# Patient Record
Sex: Female | Born: 2000 | Race: Black or African American | Hispanic: No | Marital: Single | Smoking: Never smoker
Health system: Southern US, Community
[De-identification: ages and names within clinical notes are randomized; demographics above are authoritative.]

## PROBLEM LIST (undated history)

## (undated) ENCOUNTER — Ambulatory Visit (HOSPITAL_COMMUNITY)

## (undated) HISTORY — PX: WISDOM TOOTH EXTRACTION: SHX21

---
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
## 2020-01-23 ENCOUNTER — Encounter (HOSPITAL_COMMUNITY): Payer: Self-pay

## 2020-01-23 ENCOUNTER — Ambulatory Visit (HOSPITAL_COMMUNITY)
Admission: EM | Admit: 2020-01-23 | Discharge: 2020-01-23 | Disposition: A | Discharge status: Home / Self Care | Payer: Medicaid Other | Attending: Physician Assistant | Admitting: Physician Assistant

## 2020-01-23 ENCOUNTER — Other Ambulatory Visit: Payer: Self-pay

## 2020-01-23 DIAGNOSIS — R55 Syncope and collapse: Secondary | ICD-10-CM | POA: Insufficient documentation

## 2020-01-23 LAB — COMPREHENSIVE METABOLIC PANEL
ALT: 8 U/L (ref 0–44)
AST: 14 U/L — ABNORMAL LOW (ref 15–41)
Albumin: 4.3 g/dL (ref 3.5–5.0)
Alkaline Phosphatase: 45 U/L (ref 38–126)
Anion gap: 9 (ref 5–15)
BUN: 13 mg/dL (ref 6–20)
CO2: 25 mmol/L (ref 22–32)
Calcium: 9.6 mg/dL (ref 8.9–10.3)
Chloride: 105 mmol/L (ref 98–111)
Creatinine, Ser: 0.66 mg/dL (ref 0.44–1.00)
GFR, Estimated: >60 mL/min (ref >60)
Glucose, Bld: 96 mg/dL (ref 70–99)
Potassium: 3.6 mmol/L (ref 3.5–5.1)
Sodium: 139 mmol/L (ref 135–145)
Total Bilirubin: 0.9 mg/dL (ref 0.3–1.2)
Total Protein: 7.1 g/dL (ref 6.5–8.1)

## 2020-01-23 LAB — CBC WITH DIFFERENTIAL/PLATELET
Abs Immature Granulocytes: 0.01 10*3/uL (ref 0.00–0.07)
Basophils Absolute: 0 10*3/uL (ref 0.0–0.1)
Basophils Relative: 0 %
Eosinophils Absolute: 0 10*3/uL (ref 0.0–0.5)
Eosinophils Relative: 1 %
HCT: 38 % (ref 36.0–46.0)
Hemoglobin: 12.1 g/dL (ref 12.0–15.0)
Immature Granulocytes: 0 %
Lymphocytes Relative: 32 %
Lymphs Abs: 1.3 10*3/uL (ref 0.7–4.0)
MCH: 26.7 pg (ref 26.0–34.0)
MCHC: 31.8 g/dL (ref 30.0–36.0)
MCV: 83.9 fL (ref 80.0–100.0)
Monocytes Absolute: 0.4 10*3/uL (ref 0.1–1.0)
Monocytes Relative: 9 %
Neutro Abs: 2.4 10*3/uL (ref 1.7–7.7)
Neutrophils Relative %: 58 %
Platelets: 168 10*3/uL (ref 150–400)
RBC: 4.53 MIL/uL (ref 3.87–5.11)
RDW: 12.7 % (ref 11.5–15.5)
WBC: 4.1 10*3/uL (ref 4.0–10.5)
nRBC: 0 % (ref 0.0–0.2)

## 2020-01-23 NOTE — ED Notes (Signed)
Gunnar Fusi, pt mom is with the pt in the room, room door is open.

## 2020-01-23 NOTE — Discharge Instructions (Addendum)
Make sure that you eat 3 meals a day.  Drink plenty of fluids.  Follow up at [facility] if symptoms persist

## 2020-01-23 NOTE — ED Provider Notes (Signed)
[facility]    CSN: [id number] Arrival date & time: 01/23/20  1000      History   Chief Complaint Chief Complaint  Patient presents with  . Near Syncope    HPI Jean Thomas is a 19 y.o. female.   The history is provided by the patient. No language interpreter was used.  Near Syncope This is a recurrent problem. The current episode started 6 to 12 hours ago. The problem occurs every several days. The problem has been gradually worsening. Pertinent negatives include no chest pain, no abdominal pain and no shortness of breath. Nothing aggravates the symptoms. She has tried nothing for the symptoms. The treatment provided no relief.  Pt admits to stress and some depression.  Pt thinks these episodes are probably due to stress.  Pt reports episodes usually occur at work.  Pt reports she does not eat 3 meals a day.  She normally eats at 11pm at night  History reviewed. No pertinent past medical history.  There are no problems to display for this patient.   History reviewed. No pertinent surgical history.  OB History   No obstetric history on file.      Home Medications    Prior to Admission medications   Medication Sig Start Date End Date Taking? Authorizing Provider  dicyclomine (BENTYL) 20 MG tablet Take 1 tablet (20 mg total) by mouth every 12 (twelve) hours as needed (abdominal pain/cramping). 10/11/16   Antony Madura, PA-C  ondansetron (ZOFRAN ODT) 4 MG disintegrating tablet Take 1 tablet (4 mg total) by mouth every 8 (eight) hours as needed for nausea or vomiting. 10/11/16   Antony Madura, PA-C    Family History History reviewed. No pertinent family history.  Social History Social History   Tobacco Use  . Smoking status: Never Smoker  . Smokeless tobacco: Never Used  Substance Use Topics  . Alcohol use: Not on file  . Drug use: Not on file     Allergies   Peanut-containing drug products   Review of Systems Review of Systems  Constitutional:  Negative for chills and fever.  Eyes: Negative for pain and visual disturbance.  Respiratory: Negative for cough and shortness of breath.   Cardiovascular: Positive for near-syncope. Negative for chest pain and palpitations.  Gastrointestinal: Negative for abdominal pain and vomiting.  Musculoskeletal: Negative for back pain.  Skin: Negative for rash.  All other systems reviewed and are negative.    Physical Exam Triage Vital Signs ED Triage Vitals  Enc Vitals Group     BP 01/23/20 1026 111/61     Pulse Rate 01/23/20 1026 71     Resp 01/23/20 1026 18     Temp 01/23/20 1026 98 F (36.7 C)     Temp Source 01/23/20 1026 Oral     SpO2 01/23/20 1026 100 %     Weight --      Height --      Head Circumference --      Peak Flow --      Pain Score 01/23/20 1031 0     Pain Loc --      Pain Edu? --      Excl. in [place]? --    No data found.  Updated Vital Signs BP 111/61 (BP Location: Right Arm)   Pulse 71   Temp 98 F (36.7 C) (Oral)   Resp 18   LMP  (Within Weeks) Comment: 2 weeks  SpO2 100%   Visual Acuity Right Eye Distance:  Left Eye Distance:   Bilateral Distance:    Right Eye Near:   Left Eye Near:    Bilateral Near:     Physical Exam   UC Treatments / Results  Labs (all labs ordered are listed, but only abnormal results are displayed) Labs Reviewed  COMPREHENSIVE METABOLIC PANEL - Abnormal; Notable for the following components:      Result Value   AST 14 (*)    All other components within normal limits  CBC WITH DIFFERENTIAL/PLATELET    EKG   Radiology No results found.  Procedures Procedures (including critical care time)  Medications Ordered in UC Medications - No data to display  Initial Impression / Assessment and Plan / UC Course  I have reviewed the triage vital signs and the nursing notes.  Pertinent labs & imaging results that were available during my care of the patient were reviewed by me and considered in my medical decision making  (see chart for details).     MDM:  Pt counseled on eating 3 meals aday.  Pt advised to drink plenty of fluids  Final Clinical Impressions(s) / UC Diagnoses   Final diagnoses:  Near syncope     Discharge Instructions     Make sure that you eat 3 meals a day.  Drink plenty of fluids.  Follow up at [facility] if symptoms persist    ED Prescriptions    None     PDMP not reviewed this encounter.  An After Visit Summary was printed and given to the patient.   Elson Areas, [place] 01/23/20 1555

## 2020-01-23 NOTE — ED Notes (Signed)
Provider [place] PA was notified about pt refusal to answer the suicidal assessment.

## 2020-01-23 NOTE — ED Triage Notes (Signed)
Pt reports having "black outs" since October 2021. States her vision "go off" and sometimes feeling hot before this happens.States this episodes can happen at anytime. States she is a very stress person in general, not having appetite sometimes.  Denies falling on the ground. Denies chest pain, vomiting, headache.

## 2021-11-09 ENCOUNTER — Ambulatory Visit (HOSPITAL_COMMUNITY): Payer: Self-pay

## 2022-05-24 ENCOUNTER — Ambulatory Visit (HOSPITAL_COMMUNITY)
Admission: RE | Admit: 2022-05-24 | Discharge: 2022-05-24 | Disposition: A | Discharge status: Home / Self Care | Payer: Medicaid Other | Source: Ambulatory Visit | Attending: Nurse Practitioner | Admitting: Nurse Practitioner

## 2022-05-24 ENCOUNTER — Encounter (HOSPITAL_COMMUNITY): Payer: Self-pay

## 2022-05-24 VITALS — BP 103/70 | HR 78 | Temp 98.3°F | Resp 12

## 2022-05-24 DIAGNOSIS — Z7251 High risk heterosexual behavior: Secondary | ICD-10-CM

## 2022-05-24 DIAGNOSIS — R350 Frequency of micturition: Secondary | ICD-10-CM

## 2022-05-24 LAB — POCT URINALYSIS DIPSTICK, ED / UC
Bilirubin Urine: NEGATIVE
Glucose, UA: NEGATIVE mg/dL
Ketones, ur: NEGATIVE mg/dL
Nitrite: NEGATIVE
Protein, ur: NEGATIVE mg/dL
Specific Gravity, Urine: >=1.03 (ref 1.005–1.030)
Urobilinogen, UA: 0.2 mg/dL (ref 0.0–1.0)
pH: 6 (ref 5.0–8.0)

## 2022-05-24 LAB — POC URINE PREG, ED: Preg Test, Ur: NEGATIVE

## 2022-05-24 NOTE — Discharge Instructions (Addendum)
As we discussed, the urine sample today shows a little bit of blood and likely a little bit of leukocyte Estrace.  We are sending the urine out for culture and will call you if this comes back abnormal in a couple of days.  We have also tested today for gonorrhea, chlamydia, trichomonas, HIV, and syphilis.  Will call you later this week if any of this comes back positive.  In the meantime, please use condoms with every sexual encounter.  Recommend following up with an OB/GYN if your symptoms persist and all of the testing here today is negative.  Contact information is below

## 2022-05-24 NOTE — ED Triage Notes (Signed)
Pt is here for issues with urinating. Pt states she has been having urgency , frequency , pressure on and off x 51yrthe patient has not tried any OTC meds .

## 2022-05-24 NOTE — ED Notes (Addendum)
Writer attempted to obtain blood work. Patient was moving her arm excessively and writer unable to obtain blood specimen. Patient states she did not want blood work drawn and stated, "I do not have HIV." Writer explained to patient that the tests were to see if HIV or syhphyllis were present. Patient again repeated that she did not have either one.

## 2022-05-24 NOTE — ED Provider Notes (Signed)
[facility]    CSN: [id number] Arrival date & time: 05/24/22  1353      History   Chief Complaint Chief Complaint  Patient presents with   Dysuria    HPI Jean Thomas is a 22 y.o. female.   Patient presents today for 65-monthhistory of on and off urinary frequency, urgency, voiding smaller amounts occasionally, and left low back pain.  No burning with urination, new urinary incontinence, foul urinary odor, hematuria, abdominal pain, suprapubic pain or pressure, flank pain, fever, nausea/vomiting, or vaginal discharge.  No vaginal sores, lesions, or rashes.  No groin lymphadenopathy.  Reports last menstrual period was 04/25/2022 and has been sexually active since that time.  Reports she typically has irregular periods approximately every other month.  No known exposures to STDs, however she has had recent unprotected sex and is requesting STI testing today.    History reviewed. No pertinent past medical history.  There are no problems to display for this patient.   History reviewed. No pertinent surgical history.  OB History   No obstetric history on file.      Home Medications    Prior to Admission medications   Medication Sig Start Date End Date Taking? Authorizing Provider  dicyclomine (BENTYL) 20 MG tablet Take 1 tablet (20 mg total) by mouth every 12 (twelve) hours as needed (abdominal pain/cramping). 10/11/16   HAntonietta Breach PA-C  ondansetron (ZOFRAN ODT) 4 MG disintegrating tablet Take 1 tablet (4 mg total) by mouth every 8 (eight) hours as needed for nausea or vomiting. 10/11/16   HAntonietta Breach PA-C    Family History History reviewed. No pertinent family history.  Social History Social History   Tobacco Use   Smoking status: Never   Smokeless tobacco: Never     Allergies   Peanut-containing drug products   Review of Systems Review of Systems Per HPI  Physical Exam Triage Vital Signs ED Triage Vitals  Enc Vitals Group     BP 05/24/22  1422 103/70     Pulse Rate 05/24/22 1422 78     Resp 05/24/22 1422 12     Temp 05/24/22 1422 98.3 F (36.8 C)     Temp Source 05/24/22 1422 Oral     SpO2 05/24/22 1422 98 %     Weight --      Height --      Head Circumference --      Peak Flow --      Pain Score 05/24/22 1420 0     Pain Loc --      Pain Edu? --      Excl. in [place] --    No data found.  Updated Vital Signs BP 103/70 (BP Location: Left Arm)   Pulse 78   Temp 98.3 F (36.8 C) (Oral)   Resp 12   SpO2 98%   Visual Acuity Right Eye Distance:   Left Eye Distance:   Bilateral Distance:    Right Eye Near:   Left Eye Near:    Bilateral Near:     Physical Exam Vitals and nursing note reviewed.  Constitutional:      General: She is not in acute distress.    Appearance: Normal appearance. She is not toxic-appearing.  HENT:     Mouth/Throat:     Mouth: Mucous membranes are moist.     Pharynx: Oropharynx is clear.  Abdominal:     General: Abdomen is flat. Bowel sounds are normal. There is no distension.  Palpations: Abdomen is soft.     Tenderness: There is no abdominal tenderness. There is no right CVA tenderness, left CVA tenderness, guarding or rebound.  Skin:    General: Skin is warm and dry.     Coloration: Skin is not jaundiced or pale.     Findings: No erythema.  Neurological:     Mental Status: She is alert and oriented to person, place, and time.     Motor: No weakness.     Gait: Gait normal.  Psychiatric:        Mood and Affect: Mood normal.        Behavior: Behavior is cooperative.      UC Treatments / Results  Labs (all labs ordered are listed, but only abnormal results are displayed) Labs Reviewed  POCT URINALYSIS DIPSTICK, ED / UC - Abnormal; Notable for the following components:      Result Value   Hgb urine dipstick TRACE (*)    Leukocytes,Ua TRACE (*)    All other components within normal limits  URINE CULTURE  HIV ANTIBODY (ROUTINE TESTING W REFLEX)  RPR  POC URINE PREG,  ED  CERVICOVAGINAL ANCILLARY ONLY    EKG   Radiology No results found.  Procedures Procedures (including critical care time)  Medications Ordered in UC Medications - No data to display  Initial Impression / Assessment and Plan / UC Course  I have reviewed the triage vital signs and the nursing notes.  Pertinent labs & imaging results that were available during my care of the patient were reviewed by me and considered in my medical decision making (see chart for details).   Patient is well-appearing, normotensive, afebrile, not tachycardic, not tachypneic, oxygenating well on room air.    1. Urinary frequency Urinalysis today positive for trace hemoglobin, trace leukocyte esterase Urine culture is pending Given no dysuria and chronicity, will defer treatment until urine culture results Recommended increasing hydration with water in meantime Follow-up with GYN if symptoms persist and all testing is negative  2. Unprotected sexual intercourse Self swab performed to check for gonorrhea, chlamydia, trichomonas HIV and syphilis testing also obtained per patient request Recommended condom use with every sexual encounter  The patient was given the opportunity to ask questions.  All questions answered to their satisfaction.  The patient is in agreement to this plan.    Final Clinical Impressions(s) / UC Diagnoses   Final diagnoses:  Urinary frequency  Unprotected sexual intercourse     Discharge Instructions      As we discussed, the urine sample today shows a little bit of blood and likely a little bit of leukocyte Estrace.  We are sending the urine out for culture and will call you if this comes back abnormal in a couple of days.  We have also tested today for gonorrhea, chlamydia, trichomonas, HIV, and syphilis.  Will call you later this week if any of this comes back positive.  In the meantime, please use condoms with every sexual encounter.  Recommend following up with an  OB/GYN if your symptoms persist and all of the testing here today is negative.  Contact information is below     ED Prescriptions   None    PDMP not reviewed this encounter.   Eulogio Bear, NP 05/24/22 1539

## 2022-05-24 NOTE — ED Notes (Signed)
Provider notified that the patient did not want blood work drawn.

## 2022-05-25 LAB — URINE CULTURE: Culture: NO GROWTH

## 2022-05-25 LAB — CERVICOVAGINAL ANCILLARY ONLY
Bacterial Vaginitis (gardnerella): POSITIVE — AB
Chlamydia: NEGATIVE
Comment: NEGATIVE
Comment: NEGATIVE
Comment: NEGATIVE
Comment: NORMAL
Neisseria Gonorrhea: NEGATIVE
Trichomonas: POSITIVE — AB

## 2022-05-26 ENCOUNTER — Telehealth (HOSPITAL_COMMUNITY): Payer: Self-pay | Admitting: Emergency Medicine

## 2022-05-26 MED ORDER — METRONIDAZOLE 500 MG PO TABS: 500.0000 mg | ORAL_TABLET | Freq: Two times a day (BID) | ORAL | 0 refills | Status: DC

## 2022-06-11 ENCOUNTER — Ambulatory Visit (HOSPITAL_COMMUNITY)
Admission: RE | Admit: 2022-06-11 | Discharge: 2022-06-11 | Disposition: A | Discharge status: Home / Self Care | Payer: Self-pay | Source: Ambulatory Visit | Attending: Emergency Medicine | Admitting: Emergency Medicine

## 2022-06-11 VITALS — BP 133/84 | HR 91 | Temp 98.7°F

## 2022-06-11 DIAGNOSIS — R3915 Urgency of urination: Secondary | ICD-10-CM | POA: Insufficient documentation

## 2022-06-11 DIAGNOSIS — Z113 Encounter for screening for infections with a predominantly sexual mode of transmission: Secondary | ICD-10-CM | POA: Insufficient documentation

## 2022-06-11 LAB — POCT URINALYSIS DIPSTICK, ED / UC
Bilirubin Urine: NEGATIVE
Glucose, UA: NEGATIVE mg/dL
Hgb urine dipstick: NEGATIVE
Ketones, ur: NEGATIVE mg/dL
Nitrite: NEGATIVE
Protein, ur: NEGATIVE mg/dL
Specific Gravity, Urine: 1.025 (ref 1.005–1.030)
Urobilinogen, UA: 0.2 mg/dL (ref 0.0–1.0)
pH: 6.5 (ref 5.0–8.0)

## 2022-06-11 LAB — POC URINE PREG, ED: Preg Test, Ur: NEGATIVE

## 2022-06-11 NOTE — ED Provider Notes (Signed)
[facility]    CSN: [id number] Arrival date & time: 06/11/22  [id number]      History   Chief Complaint Chief Complaint  Patient presents with   Exposure to STD    Have to talk to a doctor about concerns about what's wrong - Entered by patient    HPI Jean Thomas is a 22 y.o. female.   Patient presents to clinic for ongoing dysuria, urgency and frequency.  She visited the clinic on March 11 for similar complaints and tested positive for bacterial vaginosis and trichomoniasis.  Reports she has completed her Flagyl, did have some side effects of dizziness, nausea and diarrhea.  Since then she has felt minor improvement in symptoms, but they remain.  Her last menstrual period was sometime in February, has not had a menses in March.  She denies fever, current nausea, abdominal pain or emesis.  She reports she has not been sexually active since her last visit.   The history is provided by the patient and medical records.  Exposure to STD Pertinent negatives include no chest pain, no abdominal pain, no headaches and no shortness of breath.    No past medical history on file.  There are no problems to display for this patient.   No past surgical history on file.  OB History   No obstetric history on file.      Home Medications    Prior to Admission medications   Medication Sig Start Date End Date Taking? Authorizing Provider  dicyclomine (BENTYL) 20 MG tablet Take 1 tablet (20 mg total) by mouth every 12 (twelve) hours as needed (abdominal pain/cramping). 10/11/16   Antonietta Breach, PA-C  metroNIDAZOLE (FLAGYL) 500 MG tablet Take 1 tablet (500 mg total) by mouth 2 (two) times daily. 05/26/22   Chase Picket, MD  ondansetron (ZOFRAN ODT) 4 MG disintegrating tablet Take 1 tablet (4 mg total) by mouth every 8 (eight) hours as needed for nausea or vomiting. 10/11/16   Antonietta Breach, PA-C    Family History No family history on file.  Social History Social History    Tobacco Use   Smoking status: Never   Smokeless tobacco: Never     Allergies   Peanut-containing drug products   Review of Systems Review of Systems  Constitutional:  Negative for chills and fatigue.  HENT:  Negative for sore throat.   Respiratory:  Negative for cough and shortness of breath.   Cardiovascular:  Negative for chest pain.  Gastrointestinal:  Negative for abdominal pain.  Genitourinary:  Positive for dysuria, flank pain, frequency, menstrual problem and urgency. Negative for genital sores, hematuria, vaginal bleeding and vaginal discharge.  Musculoskeletal:  Negative for arthralgias and back pain.  Neurological:  Negative for syncope and headaches.     Physical Exam Triage Vital Signs ED Triage Vitals [06/11/22 0828]  Enc Vitals Group     BP 133/84     Pulse Rate 91     Resp      Temp 98.7 F (37.1 C)     Temp Source Oral     SpO2 98 %     Weight      Height      Head Circumference      Peak Flow      Pain Score      Pain Loc      Pain Edu?      Excl. in [place]?    No data found.  Updated Vital Signs BP 133/84 (BP  Location: Left Arm)   Pulse 91   Temp 98.7 F (37.1 C) (Oral)   SpO2 98%   Visual Acuity Right Eye Distance:   Left Eye Distance:   Bilateral Distance:    Right Eye Near:   Left Eye Near:    Bilateral Near:     Physical Exam Vitals and nursing note reviewed.  Constitutional:      General: She is not in acute distress.    Appearance: She is well-developed.  HENT:     Head: Normocephalic and atraumatic.     Right Ear: External ear normal.     Left Ear: External ear normal.     Nose: Nose normal.     Mouth/Throat:     Mouth: Mucous membranes are moist.  Eyes:     General: No scleral icterus.       Right eye: No discharge.        Left eye: No discharge.     Conjunctiva/sclera: Conjunctivae normal.  Cardiovascular:     Rate and Rhythm: Normal rate and regular rhythm.  Pulmonary:     Effort: Pulmonary effort is normal.  No respiratory distress.  Musculoskeletal:        General: No swelling. Normal range of motion.  Skin:    General: Skin is warm and dry.     Capillary Refill: Capillary refill takes less than 2 seconds.  Neurological:     Mental Status: She is alert and oriented to person, place, and time.  Psychiatric:        Mood and Affect: Mood normal.      UC Treatments / Results  Labs (all labs ordered are listed, but only abnormal results are displayed) Labs Reviewed  POCT URINALYSIS DIPSTICK, ED / UC - Abnormal; Notable for the following components:      Result Value   Leukocytes,Ua TRACE (*)    All other components within normal limits  URINE CULTURE  POC URINE PREG, ED  CERVICOVAGINAL ANCILLARY ONLY    EKG   Radiology No results found.  Procedures Procedures (including critical care time)  Medications Ordered in UC Medications - No data to display  Initial Impression / Assessment and Plan / UC Course  I have reviewed the triage vital signs and the nursing notes.  Pertinent labs & imaging results that were available during my care of the patient were reviewed by me and considered in my medical decision making (see chart for details).  Vitals in triage reviewed, patient is hemodynamically stable.  Presents to clinic for ongoing urgency, frequency and dysuria.  Urinalysis positive for trace leukocytes in clinic, will send for culture, will withhold treatment at this time for urinary tract infection.  Urine pregnancy negative, patient has irregular menses.  Cervicovaginal swab results pending, will treat appropriately based on results.  Encourage patient to establish with a primary care provider, would have preferred urology due to ongoing symptoms, however, patient is without insurance.  Discussed proper bladder care and hydration, patient verbalized understanding.  No questions at this time.  Return and follow-up care discussed.    Final Clinical Impressions(s) / UC Diagnoses    Final diagnoses:  Urinary urgency  Screen for STD (sexually transmitted disease)     Discharge Instructions      Your urine was without signs of infection, I do not believe you have a urinary tract infection.  We are sending off your vaginal swab and will call if you have any sexually transmitted infections, yeast or bacterial  vaginosis.  If these are positive, we will call and treat accordingly.  To help with your urgency and frequency please ensure you are drinking at least 64 ounces of water daily.  Avoid urinary irritants like caffeine, coffee, energy drinks, sodas and juices.  Please ensure you are wiping from front to back and wearing cotton underwear that is breathable.  You can consider starting an over-the-counter probiotic that has lactobacillus in it, the active microorganism in the healthy vagina.   Please follow-up with a primary care provider, I have attached information for [facility] [facility].  You can return to clinic if you have any new or worsening symptoms.      ED Prescriptions   None    PDMP not reviewed this encounter.   Louretta Shorten [place], [place] 06/11/22 [phone]

## 2022-06-11 NOTE — Discharge Instructions (Signed)
Your urine was without signs of infection, I do not believe you have a urinary tract infection.  We are sending off your vaginal swab and will call if you have any sexually transmitted infections, yeast or bacterial vaginosis.  If these are positive, we will call and treat accordingly.  To help with your urgency and frequency please ensure you are drinking at least 64 ounces of water daily.  Avoid urinary irritants like caffeine, coffee, energy drinks, sodas and juices.  Please ensure you are wiping from front to back and wearing cotton underwear that is breathable.  You can consider starting an over-the-counter probiotic that has lactobacillus in it, the active microorganism in the healthy vagina.   Please follow-up with a primary care provider, I have attached information for [place] [facility].  You can return to clinic if you have any new or worsening symptoms.

## 2022-06-11 NOTE — ED Triage Notes (Signed)
Pt is here for Dysuria since last visit on 05/24/2022. Pt reports her is urine is cloudy with an odor. Pt would like to be check for STD.  Pt states she completed her course of Flagyl

## 2022-06-12 LAB — URINE CULTURE: Culture: NO GROWTH

## 2022-06-14 ENCOUNTER — Telehealth (HOSPITAL_COMMUNITY): Payer: Self-pay | Admitting: Emergency Medicine

## 2022-06-14 LAB — CERVICOVAGINAL ANCILLARY ONLY
Bacterial Vaginitis (gardnerella): POSITIVE — AB
Candida Glabrata: NEGATIVE
Candida Vaginitis: POSITIVE — AB
Chlamydia: NEGATIVE
Comment: NEGATIVE
Comment: NEGATIVE
Comment: NEGATIVE
Comment: NEGATIVE
Comment: NEGATIVE
Comment: NORMAL
Neisseria Gonorrhea: NEGATIVE
Trichomonas: NEGATIVE

## 2022-06-14 MED ORDER — FLUCONAZOLE 150 MG PO TABS: 150.0000 mg | ORAL_TABLET | Freq: Once | ORAL | 0 refills | Status: AC

## 2022-06-14 MED ORDER — METRONIDAZOLE 500 MG PO TABS: 500.0000 mg | ORAL_TABLET | Freq: Two times a day (BID) | ORAL | 0 refills | Status: DC

## 2022-10-04 ENCOUNTER — Ambulatory Visit: Payer: Self-pay

## 2022-10-04 ENCOUNTER — Ambulatory Visit (HOSPITAL_COMMUNITY)
Admission: RE | Admit: 2022-10-04 | Discharge: 2022-10-04 | Disposition: A | Discharge status: Home / Self Care | Payer: Self-pay | Source: Ambulatory Visit | Attending: Emergency Medicine | Admitting: Emergency Medicine

## 2022-10-04 ENCOUNTER — Encounter (HOSPITAL_COMMUNITY): Payer: Self-pay

## 2022-10-04 ENCOUNTER — Telehealth: Payer: Self-pay | Admitting: Nurse Practitioner

## 2022-10-04 VITALS — BP 111/75 | HR 72 | Temp 98.5°F | Resp 16 | Ht 68.0 in

## 2022-10-04 DIAGNOSIS — N3001 Acute cystitis with hematuria: Secondary | ICD-10-CM | POA: Insufficient documentation

## 2022-10-04 DIAGNOSIS — Z113 Encounter for screening for infections with a predominantly sexual mode of transmission: Secondary | ICD-10-CM | POA: Insufficient documentation

## 2022-10-04 DIAGNOSIS — R300 Dysuria: Secondary | ICD-10-CM

## 2022-10-04 LAB — POCT URINALYSIS DIP (MANUAL ENTRY)
Bilirubin, UA: NEGATIVE
Glucose, UA: NEGATIVE mg/dL
Ketones, POC UA: NEGATIVE mg/dL
Nitrite, UA: NEGATIVE
Protein Ur, POC: NEGATIVE mg/dL
Spec Grav, UA: 1.025 (ref 1.010–1.025)
Urobilinogen, UA: 0.2 E.U./dL
pH, UA: 6.5 (ref 5.0–8.0)

## 2022-10-04 LAB — POCT URINE PREGNANCY: Preg Test, Ur: NEGATIVE

## 2022-10-04 MED ORDER — SULFAMETHOXAZOLE-TRIMETHOPRIM 800-160 MG PO TABS: 1.0000 | ORAL_TABLET | Freq: Two times a day (BID) | ORAL | 0 refills | Status: AC

## 2022-10-04 NOTE — ED Notes (Signed)
Pt requested this RN to draw blood from specific vein. This RN told patient it was a small vein but I would try. This RN found a good vein in her [facility] but patient refused. RN was unsuccessful in vein patient requested. Pt refusing blood work at this time. Provider notified.

## 2022-10-04 NOTE — ED Provider Notes (Signed)
[facility]    CSN: [id number] Arrival date & time: 10/04/22  1215    HISTORY   Chief Complaint  Patient presents with   Exposure to STD    Entered by patient   HPI Jean Thomas is a pleasant, 22 y.o. female who presents to urgent care today. Patient complains of increased frequency of urination and left-sided lower back pain for the past 9 to 10 days.  Patient denies fever, malaise, fatigue, body aches, chills, hematuria, urine malodor.  Patient states he had unprotected sexual intercourse 10 days ago, would like to be tested for STDs.  Patient states she is feeling anxious about this situation.  Patient denies abnormal vaginal discharge, genital lesion, vaginal itching or burning, pelvic pain, pelvic pressure.  The history is provided by the patient.   History reviewed. No pertinent past medical history. There are no problems to display for this patient.  History reviewed. No pertinent surgical history. OB History   No obstetric history on file.    Home Medications    Prior to Admission medications   Medication Sig Start Date End Date Taking? Authorizing Provider  dicyclomine (BENTYL) 20 MG tablet Take 1 tablet (20 mg total) by mouth every 12 (twelve) hours as needed (abdominal pain/cramping). 10/11/16   Antony Madura, PA-C  metroNIDAZOLE (FLAGYL) 500 MG tablet Take 1 tablet (500 mg total) by mouth 2 (two) times daily. 06/14/22   Merrilee Jansky, MD  ondansetron (ZOFRAN ODT) 4 MG disintegrating tablet Take 1 tablet (4 mg total) by mouth every 8 (eight) hours as needed for nausea or vomiting. 10/11/16   Antony Madura, PA-C    Family History History reviewed. No pertinent family history. Social History Social History   Tobacco Use   Smoking status: Never   Smokeless tobacco: Never   Allergies   Peanut-containing drug products  Review of Systems Review of Systems Pertinent findings revealed after performing a 14 point review of systems has been noted in the  history of present illness.  Physical Exam Vital Signs BP 111/75 (BP Location: Left Arm)   Pulse 72   Temp 98.5 F (36.9 C) (Oral)   Resp 16   Ht 5\' 8"  (1.727 m)   LMP 10/03/2022 (Approximate)   SpO2 98%   No data found.  Physical Exam Vitals and nursing note reviewed.  Constitutional:      General: She is not in acute distress.    Appearance: Normal appearance. She is not ill-appearing.  HENT:     Head: Normocephalic and atraumatic.  Eyes:     General: Lids are normal.        Right eye: No discharge.        Left eye: No discharge.     Extraocular Movements: Extraocular movements intact.     Conjunctiva/sclera: Conjunctivae normal.     Right eye: Right conjunctiva is not injected.     Left eye: Left conjunctiva is not injected.  Neck:     Trachea: Trachea and phonation normal.  Cardiovascular:     Rate and Rhythm: Normal rate and regular rhythm.     Pulses: Normal pulses.     Heart sounds: Normal heart sounds. No murmur heard.    No friction rub. No gallop.  Pulmonary:     Effort: Pulmonary effort is normal. No accessory muscle usage, prolonged expiration or respiratory distress.     Breath sounds: Normal breath sounds. No stridor, decreased air movement or transmitted upper airway sounds. No decreased breath sounds,  wheezing, rhonchi or rales.  Chest:     Chest wall: No tenderness.  Abdominal:     General: Abdomen is flat. Bowel sounds are normal. There is no distension.     Palpations: Abdomen is soft.     Tenderness: There is no abdominal tenderness. There is no right CVA tenderness or left CVA tenderness.     Hernia: No hernia is present.  Genitourinary:    Comments: Patient politely declines pelvic exam today, patient provided a vaginal swab for testing. Musculoskeletal:        General: Normal range of motion.     Cervical back: Normal range of motion and neck supple. Normal range of motion.  Lymphadenopathy:     Cervical: No cervical adenopathy.  Skin:     General: Skin is warm and dry.     Findings: No erythema or rash.  Neurological:     General: No focal deficit present.     Mental Status: She is alert and oriented to person, place, and time.  Psychiatric:        Mood and Affect: Mood normal.        Behavior: Behavior normal.     Visual Acuity Right Eye Distance:   Left Eye Distance:   Bilateral Distance:    Right Eye Near:   Left Eye Near:    Bilateral Near:     UC Couse / Diagnostics / Procedures:     Radiology No results found.  Procedures Procedures (including critical care time) EKG  Pending results:  Labs Reviewed  POCT URINALYSIS DIP (MANUAL ENTRY) - Abnormal; Notable for the following components:      Result Value   Blood, UA large (*)    Leukocytes, UA Moderate (2+) (*)    All other components within normal limits  HIV ANTIBODY (ROUTINE TESTING W REFLEX)  RPR  POCT URINE PREGNANCY  CERVICOVAGINAL ANCILLARY ONLY    Medications Ordered in UC: Medications - No data to display  UC Diagnoses / Final Clinical Impressions(s)   I have reviewed the triage vital signs and the nursing notes.  Pertinent labs & imaging results that were available during my care of the patient were reviewed by me and considered in my medical decision making (see chart for details).    Final diagnoses:  Screen for STD (sexually transmitted disease)  Acute cystitis with hematuria   STD screening was performed, patient advised that the results be posted to their MyChart and if any of the results are positive, they will be notified by phone, further treatment will be provided as indicated based on results of STD screening. Patient was advised to abstain from sexual intercourse until that they receive the results of their STD testing.  Patient was also advised to use condoms to protect themselves from STD exposure.  Urine dip today revealed hematuria to a large degree and pyuria.   Patient was advised to begin antibiotics now due to  findings on urine dip. Patient was advised to begin antibiotics today due to having active symptoms of urinary tract infection.                    Patient was advised to take all doses exactly as prescribed.  Patient also advised of risks of worsening infection with incomplete antibiotic therapy. Return precautions advised.  Please see discharge instructions below for details of plan of care as provided to patient. ED Prescriptions     Medication Sig Dispense Auth. Provider   sulfamethoxazole-trimethoprim (  BACTRIM DS) 800-160 MG tablet Take 1 tablet by mouth 2 (two) times daily for 5 days. 10 tablet Theadora Rama Scales, PA-C      PDMP not reviewed this encounter.  Disposition Upon Discharge:  Condition: stable for discharge home  Patient presented with concern for an acute illness with associated systemic symptoms and significant discomfort requiring urgent management. In my opinion, this is a condition that a prudent lay person (someone who possesses an average knowledge of health and medicine) may potentially expect to result in complications if not addressed urgently such as respiratory distress, impairment of bodily function or dysfunction of bodily organs.   As such, the patient has been evaluated and assessed, work-up was performed and treatment was provided in alignment with urgent care protocols and evidence based medicine.  Patient/parent/caregiver has been advised that the patient may require follow up for further testing and/or treatment if the symptoms continue in spite of treatment, as clinically indicated and appropriate.  Routine symptom specific, illness specific and/or disease specific instructions were discussed with the patient and/or caregiver at length.  Prevention strategies for avoiding STD exposure were also discussed.  The patient will follow up with their current PCP if and as advised. If the patient does not currently have a PCP we will assist them in obtaining  one.   The patient may need specialty follow up if the symptoms continue, in spite of conservative treatment and management, for further workup, evaluation, consultation and treatment as clinically indicated and appropriate.  Patient/parent/caregiver verbalized understanding and agreement of plan as discussed.  All questions were addressed during visit.  Please see discharge instructions below for further details of plan.  Discharge Instructions:   Discharge Instructions      Common causes of urinary tract infections include but are not limited to holding your urine longer than you should, squatting instead of sitting down when urinating, sitting around in wet clothing such as a wet swimsuit or gym clothes too long, not emptying your bladder after having sexual intercourse, wiping from back to front instead of front to back after having a bowel movement.     The urinalysis that we performed in the clinic today was abnormal.     You were advised to begin antibiotics today because your urinalysis is abnormal and you are having active symptoms of an acute lower urinary tract infection also known as cystitis.  It is very important that you take all doses exactly as prescribed.  Incomplete antibiotic therapy can cause worsening urinary tract infection that can become aggressive, escape from urinary tract into your bloodstream causing sepsis which will require hospitalization.   Please pick up and begin taking your prescription for Bactrim DS (trimethoprim sulfamethoxazole) as soon as possible.  Please take all doses exactly as prescribed.  You can take this medication with or without food.  This medication is safe to take with your other medications.   If you have not had complete resolution of your symptoms after completing treatment as prescribed, please return to urgent care for repeat evaluation or follow-up with your primary care provider.  Repeat urinalysis and urine culture may be indicated for  more directed therapy.   The results of your vaginal swab test which screens for BV, yeast, gonorrhea, chlamydia and trichomonas will be made posted to your MyChart account once it is complete.  This typically takes 2 to 4 days.  Please abstain from sexual intercourse of any kind, vaginal, oral or anal, until you have received the  results of your STD testing.     The results of your HIV and syphilis blood tests will be made available to you once they are complete.  They will initially be posted to your MyChart account which typically takes 2 to 3 days.     If any of your results are abnormal, you will receive a phone call regarding treatment.  Prescriptions, if any are needed, will be provided for you at your pharmacy.     Your urine pregnancy test today is negative.    Thank you for visiting urgent care today.  I appreciate the opportunity to participate in your care.       This office note has been dictated using Teaching laboratory technician.  Unfortunately, this method of dictation can sometimes lead to typographical or grammatical errors.  I apologize for your inconvenience in advance if this occurs.  Please do not hesitate to reach out to me if clarification is needed.       Theadora Rama Scales, [place] 10/04/22 [phone]

## 2022-10-04 NOTE — Discharge Instructions (Signed)
Common causes of urinary tract infections include but are not limited to holding your urine longer than you should, squatting instead of sitting down when urinating, sitting around in wet clothing such as a wet swimsuit or gym clothes too long, not emptying your bladder after having sexual intercourse, wiping from back to front instead of front to back after having a bowel movement.     The urinalysis that we performed in the clinic today was abnormal.     You were advised to begin antibiotics today because your urinalysis is abnormal and you are having active symptoms of an acute lower urinary tract infection also known as cystitis.  It is very important that you take all doses exactly as prescribed.  Incomplete antibiotic therapy can cause worsening urinary tract infection that can become aggressive, escape from urinary tract into your bloodstream causing sepsis which will require hospitalization.   Please pick up and begin taking your prescription for Bactrim DS (trimethoprim sulfamethoxazole) as soon as possible.  Please take all doses exactly as prescribed.  You can take this medication with or without food.  This medication is safe to take with your other medications.   If you have not had complete resolution of your symptoms after completing treatment as prescribed, please return to urgent care for repeat evaluation or follow-up with your primary care provider.  Repeat urinalysis and urine culture may be indicated for more directed therapy.   The results of your vaginal swab test which screens for BV, yeast, gonorrhea, chlamydia and trichomonas will be made posted to your MyChart account once it is complete.  This typically takes 2 to 4 days.  Please abstain from sexual intercourse of any kind, vaginal, oral or anal, until you have received the results of your STD testing.     The results of your HIV and syphilis blood tests will be made available to you once they are complete.  They will  initially be posted to your MyChart account which typically takes 2 to 3 days.     If any of your results are abnormal, you will receive a phone call regarding treatment.  Prescriptions, if any are needed, will be provided for you at your pharmacy.     Your urine pregnancy test today is negative.    Thank you for visiting urgent care today.  I appreciate the opportunity to participate in your care.

## 2022-10-04 NOTE — Progress Notes (Signed)
I feel your condition warrants further evaluation and I recommend that you be seen for a face to face visit.  Please contact your primary care physician practice to be seen. Many offices offer virtual options to be seen via video if you are not comfortable going in person to a medical facility at this time.  NOTE: You will NOT be charged for this eVisit.  If you do not have a PCP, [facility] offers a free physician referral service available at [phone]. Our trained staff has the experience, knowledge and resources to put you in touch with a physician who is right for you.    If you are having a true medical emergency please call 911.   Your e-visit answers were reviewed by a board certified advanced clinical practitioner to complete your personal care plan.  Thank you for using e-Visits.  

## 2022-10-04 NOTE — ED Triage Notes (Signed)
Patient here today with c/o frequent urination and left side LB pain X 9-10 days.   She is concerns about STDs because she recently had unprotected sex.   Patient also wants to discuss anxiety.

## 2022-10-05 LAB — CERVICOVAGINAL ANCILLARY ONLY
Bacterial Vaginitis (gardnerella): POSITIVE — AB
Candida Glabrata: NEGATIVE
Candida Vaginitis: NEGATIVE
Chlamydia: NEGATIVE
Comment: NEGATIVE
Comment: NEGATIVE
Comment: NEGATIVE
Comment: NEGATIVE
Comment: NEGATIVE
Comment: NORMAL
Neisseria Gonorrhea: NEGATIVE
Trichomonas: NEGATIVE

## 2022-10-06 ENCOUNTER — Telehealth: Payer: Self-pay | Admitting: Emergency Medicine

## 2022-10-06 MED ORDER — METRONIDAZOLE 500 MG PO TABS: 500.0000 mg | ORAL_TABLET | Freq: Two times a day (BID) | ORAL | 0 refills | Status: DC

## 2022-10-08 ENCOUNTER — Telehealth (HOSPITAL_COMMUNITY): Payer: Self-pay | Admitting: Emergency Medicine

## 2022-10-08 NOTE — Telephone Encounter (Signed)
Patient left message with front desk that she needed a refill on her UTI medication due to having misplaced it.  Metronidazole sent two days ago Reviewed with Lillia Abed, APP who recommends patient finish Metronidazole medication and f/u if symptoms continue, no additional medications at this time Attempted to reach patient x 1, LVM on home and mobile

## 2022-10-08 NOTE — Telephone Encounter (Signed)
Patient returned voicemail Reviewed provider recommendations with patient and all questions answered

## 2022-10-20 ENCOUNTER — Telehealth (HOSPITAL_COMMUNITY): Payer: Self-pay | Admitting: Emergency Medicine

## 2022-10-20 NOTE — Telephone Encounter (Signed)
Patient left a voicemail stating she had shortness of breath and chest pain yesterday at work for over 30 minutes, and felt like it was related to her Metronidazole.   Attempted to return call to let her know she needs to be seen, it's been over a week since it was prescribed, and her symptoms sounded concerning, left voicemail

## 2022-10-21 ENCOUNTER — Telehealth (HOSPITAL_COMMUNITY): Payer: Self-pay

## 2022-10-21 NOTE — Telephone Encounter (Signed)
Pt called about the side effects she had while taking Flagyl. Pt expressed experiencing trouble breathing, palpitations and chest tightness -- she spoke to a pharmacist who advised her to stop taking the medication. Advised pt to see a physician as soon as she could at the ER or [facility]. Pt expressed she was not made aware of side effects. I expressed to the pt she can come in to UC, see an OBGYN or primary to follow up on vaginal sxs. Expressed she will come back in to be seen. Pt also is requesting to be given a different abx.

## 2022-10-22 ENCOUNTER — Ambulatory Visit (HOSPITAL_COMMUNITY): Payer: Self-pay

## 2022-10-22 ENCOUNTER — Ambulatory Visit
Admission: RE | Admit: 2022-10-22 | Discharge: 2022-10-22 | Disposition: A | Discharge status: Home / Self Care | Payer: Self-pay | Source: Ambulatory Visit | Attending: Physician Assistant | Admitting: Physician Assistant

## 2022-10-22 ENCOUNTER — Ambulatory Visit: Payer: Self-pay

## 2022-10-22 VITALS — BP 104/74 | HR 67 | Temp 98.2°F | Resp 16

## 2022-10-22 DIAGNOSIS — R8290 Unspecified abnormal findings in urine: Secondary | ICD-10-CM | POA: Insufficient documentation

## 2022-10-22 LAB — POCT URINALYSIS DIP (MANUAL ENTRY)
Bilirubin, UA: NEGATIVE
Blood, UA: NEGATIVE
Glucose, UA: NEGATIVE mg/dL
Ketones, POC UA: NEGATIVE mg/dL
Nitrite, UA: NEGATIVE
Protein Ur, POC: NEGATIVE mg/dL
Spec Grav, UA: 1.025 (ref 1.010–1.025)
Urobilinogen, UA: 0.2 E.U./dL
pH, UA: 7 (ref 5.0–8.0)

## 2022-10-22 MED ORDER — CIPROFLOXACIN HCL 500 MG PO TABS: 500.0000 mg | ORAL_TABLET | Freq: Two times a day (BID) | ORAL | 0 refills | Status: DC

## 2022-10-22 NOTE — ED Triage Notes (Signed)
Pt states she is having urinary frequency for the past 2 weeks.  Pt also had BV and was on Flagyl for 5 days.  States it made her sick. Was seen here for the same.

## 2022-10-22 NOTE — ED Provider Notes (Signed)
[facility]    CSN: [id number] Arrival date & time: 10/22/22  1137      History   Chief Complaint Chief Complaint  Patient presents with   Urinary Frequency    HPI Jean Thomas is a 22 y.o. female.   Patient here today for urinary frequency she has had the last 2 weeks. She was recently started on treatment for UTI but then was instructed to discontinue and start medication for BV. She states that she had suspected reaction to metronidazole but has had flagyl in the past.  The history is provided by the patient.  Urinary Frequency Pertinent negatives include no abdominal pain.    History reviewed. No pertinent past medical history.  There are no problems to display for this patient.   History reviewed. No pertinent surgical history.  OB History   No obstetric history on file.      Home Medications    Prior to Admission medications   Medication Sig Start Date End Date Taking? Authorizing Provider  ciprofloxacin (CIPRO) 500 MG tablet Take 1 tablet (500 mg total) by mouth every 12 (twelve) hours. 10/22/22  Yes Tomi Bamberger, PA-C  dicyclomine (BENTYL) 20 MG tablet Take 1 tablet (20 mg total) by mouth every 12 (twelve) hours as needed (abdominal pain/cramping). 10/11/16   Antony Madura, PA-C  metroNIDAZOLE (FLAGYL) 500 MG tablet Take 1 tablet (500 mg total) by mouth 2 (two) times daily. 10/06/22   Merrilee Jansky, MD  ondansetron (ZOFRAN ODT) 4 MG disintegrating tablet Take 1 tablet (4 mg total) by mouth every 8 (eight) hours as needed for nausea or vomiting. 10/11/16   Antony Madura, PA-C    Family History History reviewed. No pertinent family history.  Social History Social History   Tobacco Use   Smoking status: Never   Smokeless tobacco: Never     Allergies   Peanut-containing drug products   Review of Systems Review of Systems  Constitutional:  Negative for chills and fever.  Eyes:  Negative for discharge and redness.  Gastrointestinal:   Negative for abdominal pain, nausea and vomiting.  Genitourinary:  Positive for frequency.     Physical Exam Triage Vital Signs ED Triage Vitals  Encounter Vitals Group     BP      Systolic BP Percentile      Diastolic BP Percentile      Pulse      Resp      Temp      Temp src      SpO2      Weight      Height      Head Circumference      Peak Flow      Pain Score      Pain Loc      Pain Education      Exclude from Growth Chart    No data found.  Updated Vital Signs BP 104/74 (BP Location: Left Arm)   Pulse 67   Temp 98.2 F (36.8 C) (Oral)   Resp 16   LMP 10/03/2022 (Approximate)   SpO2 99%      Physical Exam Vitals and nursing note reviewed.  Constitutional:      General: She is not in acute distress.    Appearance: Normal appearance. She is not ill-appearing.  HENT:     Head: Normocephalic and atraumatic.  Eyes:     Conjunctiva/sclera: Conjunctivae normal.  Cardiovascular:     Rate and Rhythm: Normal rate.  Pulmonary:  Effort: Pulmonary effort is normal. No respiratory distress.  Neurological:     Mental Status: She is alert.  Psychiatric:        Mood and Affect: Mood normal.        Behavior: Behavior normal.        Thought Content: Thought content normal.      UC Treatments / Results  Labs (all labs ordered are listed, but only abnormal results are displayed) Labs Reviewed  POCT URINALYSIS DIP (MANUAL ENTRY) - Abnormal; Notable for the following components:      Result Value   Leukocytes, UA Small (1+) (*)    All other components within normal limits  URINE CULTURE    EKG   Radiology No results found.  Procedures Procedures (including critical care time)  Medications Ordered in UC Medications - No data to display  Initial Impression / Assessment and Plan / UC Course  I have reviewed the triage vital signs and the nursing notes.  Pertinent labs & imaging results that were available during my care of the patient were reviewed  by me and considered in my medical decision making (see chart for details).    Cipro prescribed to cover UTI and will order urine culture. Advised patient to discuss medication with pharmacist. Patient expresses understanding.   Final Clinical Impressions(s) / UC Diagnoses   Final diagnoses:  Abnormal urinalysis   Discharge Instructions   None    ED Prescriptions     Medication Sig Dispense Auth. Provider   ciprofloxacin (CIPRO) 500 MG tablet Take 1 tablet (500 mg total) by mouth every 12 (twelve) hours. 10 tablet Tomi Bamberger, PA-C      PDMP not reviewed this encounter.   Tomi Bamberger, PA-C 10/22/22 1438

## 2023-05-19 ENCOUNTER — Ambulatory Visit
Admission: EM | Admit: 2023-05-19 | Discharge: 2023-05-19 | Disposition: A | Discharge status: Home / Self Care | Payer: Self-pay | Attending: Physician Assistant | Admitting: Physician Assistant

## 2023-05-19 DIAGNOSIS — K529 Noninfective gastroenteritis and colitis, unspecified: Secondary | ICD-10-CM | POA: Insufficient documentation

## 2023-05-19 DIAGNOSIS — N898 Other specified noninflammatory disorders of vagina: Secondary | ICD-10-CM | POA: Insufficient documentation

## 2023-05-19 LAB — POCT URINALYSIS DIP (MANUAL ENTRY)
Bilirubin, UA: NEGATIVE
Blood, UA: NEGATIVE
Glucose, UA: NEGATIVE mg/dL
Ketones, POC UA: NEGATIVE mg/dL
Leukocytes, UA: NEGATIVE
Nitrite, UA: NEGATIVE
Protein Ur, POC: NEGATIVE mg/dL
Spec Grav, UA: 1.025 (ref 1.010–1.025)
Urobilinogen, UA: 0.2 U/dL
pH, UA: 7 (ref 5.0–8.0)

## 2023-05-19 MED ORDER — ONDANSETRON 4 MG PO TBDP: 4.0000 mg | ORAL_TABLET | Freq: Three times a day (TID) | ORAL | 0 refills | Status: DC | PRN

## 2023-05-19 NOTE — ED Provider Notes (Signed)
 [facility]    CSN: [id number] Arrival date & time: 05/19/23  1155      History   Chief Complaint Chief Complaint  Patient presents with   Abdominal Pain   Diarrhea    HPI Jean Thomas is a 23 y.o. female.   Patient here today for evaluation of abdominal pain and loose stools that started yesterday.  She has also had some urinary frequency and vaginal discharge concerning for BV.  She has had some nausea but no vomiting.  She denies any fever.  The history is provided by the patient.  Abdominal Pain Associated symptoms: diarrhea, nausea and vaginal discharge   Associated symptoms: no chills, no cough, no fever, no shortness of breath, no sore throat and no vomiting   Diarrhea Associated symptoms: abdominal pain   Associated symptoms: no chills, no fever and no vomiting     History reviewed. No pertinent past medical history.  There are no active problems to display for this patient.   History reviewed. No pertinent surgical history.  OB History   No obstetric history on file.      Home Medications    Prior to Admission medications   Medication Sig Start Date End Date Taking? Authorizing Provider  ibuprofen (ADVIL) 800 MG tablet Take 800 mg by mouth every 8 (eight) hours as needed. 02/15/23  Yes [provider]  ondansetron (ZOFRAN-ODT) 4 MG disintegrating tablet Take 1 tablet (4 mg total) by mouth every 8 (eight) hours as needed. 05/19/23  Yes Tomi Bamberger, PA-C  neomycin-polymyxin b-dexamethasone (MAXITROL) 3.5-10000-0.1 SUSP INSTILL 1 DROP INTO EACH EYE 4 TIMES DAILY FOR 2 WEEKS 01/06/23   [provider]    Family History History reviewed. No pertinent family history.  Social History Social History   Tobacco Use   Smoking status: Never   Smokeless tobacco: Never  Vaping Use   Vaping status: Never Used  Substance Use Topics   Alcohol use: Never   Drug use: Never     Allergies   Peanut-containing drug  products   Review of Systems Review of Systems  Constitutional:  Negative for chills and fever.  HENT:  Negative for congestion, ear pain and sore throat.   Eyes:  Negative for discharge and redness.  Respiratory:  Negative for cough, shortness of breath and wheezing.   Gastrointestinal:  Positive for abdominal pain, diarrhea and nausea. Negative for blood in stool and vomiting.  Genitourinary:  Positive for frequency and vaginal discharge.     Physical Exam Triage Vital Signs ED Triage Vitals  Encounter Vitals Group     BP 05/19/23 1220 94/81     Systolic BP Percentile --      Diastolic BP Percentile --      Pulse Rate 05/19/23 1220 72     Resp 05/19/23 1220 16     Temp 05/19/23 1220 97.9 F (36.6 C)     Temp Source 05/19/23 1220 Oral     SpO2 05/19/23 1220 100 %     Weight 05/19/23 1216 108 lb 11 oz (49.3 kg)     Height 05/19/23 1216 5\' 8"  (1.727 m)     Head Circumference --      Peak Flow --      Pain Score 05/19/23 1211 9     Pain Loc --      Pain Education --      Exclude from Growth Chart --    No data found.  Updated Vital Signs BP  94/81 (BP Location: Left Arm)   Pulse 72   Temp 97.9 F (36.6 C) (Oral)   Resp 16   Ht 5\' 8"  (1.727 m)   Wt 108 lb 11 oz (49.3 kg)   LMP 04/30/2023 (Exact Date)   SpO2 100%   BMI 16.53 kg/m   Visual Acuity Right Eye Distance:   Left Eye Distance:   Bilateral Distance:    Right Eye Near:   Left Eye Near:    Bilateral Near:     Physical Exam Vitals and nursing note reviewed.  Constitutional:      General: She is not in acute distress.    Appearance: Normal appearance. She is not ill-appearing.  HENT:     Head: Normocephalic and atraumatic.  Eyes:     Conjunctiva/sclera: Conjunctivae normal.  Cardiovascular:     Rate and Rhythm: Normal rate and regular rhythm.     Heart sounds: Normal heart sounds.  Pulmonary:     Effort: Pulmonary effort is normal. No respiratory distress.     Breath sounds: Normal breath  sounds. No wheezing, rhonchi or rales.  Abdominal:     General: Abdomen is flat. Bowel sounds are normal. There is no distension.     Palpations: Abdomen is soft.     Tenderness: There is no abdominal tenderness. There is no guarding.  Neurological:     Mental Status: She is alert.  Psychiatric:        Mood and Affect: Mood normal.        Behavior: Behavior normal.        Thought Content: Thought content normal.      UC Treatments / Results  Labs (all labs ordered are listed, but only abnormal results are displayed) Labs Reviewed  POCT URINALYSIS DIP (MANUAL ENTRY) - Abnormal; Notable for the following components:      Result Value   Clarity, UA cloudy (*)    All other components within normal limits  CERVICOVAGINAL ANCILLARY ONLY    EKG   Radiology No results found.  Procedures Procedures (including critical care time)  Medications Ordered in UC Medications - No data to display  Initial Impression / Assessment and Plan / UC Course  I have reviewed the triage vital signs and the nursing notes.  Pertinent labs & imaging results that were available during my care of the patient were reviewed by me and considered in my medical decision making (see chart for details).    Zofran prescribed for nausea.  Suspect likely viral gastroenteritis as cause of diarrhea.  Recommended bland diet, increase fluids and rest with electrolyte replacement.  Encouraged follow-up if no gradual improvement with any worsening symptoms.  Screening ordered for BV, yeast, gonorrhea, chlamydia and trichomonas.  UA without concerning findings.  Final Clinical Impressions(s) / UC Diagnoses   Final diagnoses:  Vaginal discharge  Gastroenteritis   Discharge Instructions   None    ED Prescriptions     Medication Sig Dispense Auth. Provider   ondansetron (ZOFRAN-ODT) 4 MG disintegrating tablet Take 1 tablet (4 mg total) by mouth every 8 (eight) hours as needed. 20 tablet Tomi Bamberger, PA-C       PDMP not reviewed this encounter.   Tomi Bamberger, PA-C 05/19/23 1733

## 2023-05-19 NOTE — ED Notes (Signed)
 Note: Patient did take a lot of time to answer these questions.

## 2023-05-19 NOTE — ED Triage Notes (Signed)
"  This started last night into this morning and today after eating some food yesterday (Abd pain) and having loose stools following abd pain, nausea but no vomiting". No fever.

## 2023-05-19 NOTE — ED Triage Notes (Signed)
"  Also having urinary frequency and concerns for BV".

## 2023-05-20 ENCOUNTER — Telehealth (HOSPITAL_COMMUNITY): Payer: Self-pay

## 2023-05-20 LAB — CERVICOVAGINAL ANCILLARY ONLY
Bacterial Vaginitis (gardnerella): POSITIVE — AB
Candida Glabrata: NEGATIVE
Candida Vaginitis: POSITIVE — AB
Chlamydia: NEGATIVE
Comment: NEGATIVE
Comment: NEGATIVE
Comment: NEGATIVE
Comment: NEGATIVE
Comment: NEGATIVE
Comment: NORMAL
Neisseria Gonorrhea: NEGATIVE
Trichomonas: NEGATIVE

## 2023-05-20 MED ORDER — FLUCONAZOLE 150 MG PO TABS: 150.0000 mg | ORAL_TABLET | Freq: Once | ORAL | 0 refills | Status: AC

## 2023-05-20 MED ORDER — METRONIDAZOLE 500 MG PO TABS: 500.0000 mg | ORAL_TABLET | Freq: Two times a day (BID) | ORAL | 0 refills | Status: AC

## 2023-05-20 NOTE — Telephone Encounter (Signed)
 Per protocol, pt requires tx with metronidazole and Diflucan.  Rx sent to pharmacy on file.

## 2023-06-20 ENCOUNTER — Encounter (HOSPITAL_COMMUNITY): Payer: Self-pay

## 2023-06-20 ENCOUNTER — Ambulatory Visit (HOSPITAL_COMMUNITY)
Admission: RE | Admit: 2023-06-20 | Discharge: 2023-06-20 | Disposition: A | Discharge status: Home / Self Care | Payer: Self-pay | Source: Ambulatory Visit | Attending: Emergency Medicine | Admitting: Emergency Medicine

## 2023-06-20 ENCOUNTER — Other Ambulatory Visit: Payer: Self-pay

## 2023-06-20 VITALS — BP 97/68 | HR 84 | Temp 99.3°F | Resp 18

## 2023-06-20 DIAGNOSIS — R350 Frequency of micturition: Secondary | ICD-10-CM | POA: Insufficient documentation

## 2023-06-20 LAB — POCT URINALYSIS DIP (MANUAL ENTRY)
Bilirubin, UA: NEGATIVE
Blood, UA: NEGATIVE
Glucose, UA: NEGATIVE mg/dL
Ketones, POC UA: NEGATIVE mg/dL
Nitrite, UA: NEGATIVE
Protein Ur, POC: NEGATIVE mg/dL
Spec Grav, UA: 1.02 (ref 1.010–1.025)
Urobilinogen, UA: 1 U/dL
pH, UA: 8.5 — AB (ref 5.0–8.0)

## 2023-06-20 LAB — POCT URINE PREGNANCY: Preg Test, Ur: NEGATIVE

## 2023-06-20 NOTE — ED Provider Notes (Signed)
 [facility]    CSN: [id number] Arrival date & time: 06/20/23  1641      History   Chief Complaint Chief Complaint  Patient presents with   Appointment    17:00   Urinary Frequency    HPI Jean Thomas is a 23 y.o. female.   Patient presents with urinary urgency and frequency x 2 to 2-1/2 weeks.  Patient unsure about increased vaginal discharge, stating that she "does not really pay attention to that".  Denies dysuria, hematuria, abdominal pain, vaginal bleeding, nausea, vomiting, diarrhea, flank pain, and fever.  Patient reports history of bacterial vaginosis in requesting testing today to ensure that this has resolved.  Patient reports she was last sexually active in February.  LMP 05/31/2023.   Urinary Frequency    History reviewed. No pertinent past medical history.  There are no active problems to display for this patient.   History reviewed. No pertinent surgical history.  OB History   No obstetric history on file.      Home Medications    Prior to Admission medications   Not on File    Family History History reviewed. No pertinent family history.  Social History Social History   Tobacco Use   Smoking status: Never   Smokeless tobacco: Never  Vaping Use   Vaping status: Never Used  Substance Use Topics   Alcohol use: Never   Drug use: Never     Allergies   Peanut-containing drug products   Review of Systems Review of Systems  Genitourinary:  Positive for frequency.   Per HPI  Physical Exam Triage Vital Signs ED Triage Vitals  Encounter Vitals Group     BP 06/20/23 1713 97/68     Systolic BP Percentile --      Diastolic BP Percentile --      Pulse Rate 06/20/23 1713 84     Resp 06/20/23 1713 18     Temp 06/20/23 1713 99.3 F (37.4 C)     Temp Source 06/20/23 1713 Oral     SpO2 06/20/23 1713 96 %     Weight --      Height --      Head Circumference --      Peak Flow --      Pain Score 06/20/23 1711 8     Pain  Loc --      Pain Education --      Exclude from Growth Chart --    No data found.  Updated Vital Signs BP 97/68 (BP Location: Right Arm)   Pulse 84   Temp 99.3 F (37.4 C) (Oral)   Resp 18   LMP 05/31/2023   SpO2 96%   Visual Acuity Right Eye Distance:   Left Eye Distance:   Bilateral Distance:    Right Eye Near:   Left Eye Near:    Bilateral Near:     Physical Exam Vitals and nursing note reviewed.  Constitutional:      General: She is awake. She is not in acute distress.    Appearance: Normal appearance. She is well-developed and well-groomed. She is not ill-appearing.  Abdominal:     General: Abdomen is flat. There is no distension.     Palpations: Abdomen is soft.     Tenderness: There is no abdominal tenderness. There is no right CVA tenderness, left CVA tenderness, guarding or rebound.  Genitourinary:    Comments: Exam deferred Skin:    General: Skin is warm and dry.  Neurological:     Mental Status: She is alert.  Psychiatric:        Behavior: Behavior is cooperative.      UC Treatments / Results  Labs (all labs ordered are listed, but only abnormal results are displayed) Labs Reviewed  POCT URINALYSIS DIP (MANUAL ENTRY) - Abnormal; Notable for the following components:      Result Value   Clarity, UA hazy (*)    pH, UA 8.5 (*)    Leukocytes, UA Trace (*)    All other components within normal limits  URINE CULTURE  POCT URINE PREGNANCY  CERVICOVAGINAL ANCILLARY ONLY    EKG   Radiology No results found.  Procedures Procedures (including critical care time)  Medications Ordered in UC Medications - No data to display  Initial Impression / Assessment and Plan / UC Course  I have reviewed the triage vital signs and the nursing notes.  Pertinent labs & imaging results that were available during my care of the patient were reviewed by me and considered in my medical decision making (see chart for details).     Patient is well-appearing  upon exam.  Vitals stable.  No significant findings upon assessment.  GU exam deferred.  Patient perform self swab for STD/STI.  Urinalysis is significant, will send culture to confirm no presence of infection.  Urine pregnancy negative.  Given urology to follow-up with if symptoms continue to recur.  Discussed follow-up and return precautions. Final Clinical Impressions(s) / UC Diagnoses   Final diagnoses:  Urinary frequency     Discharge Instructions      Your results will come back over the next few days and someone will call if results are positive and require treatment.  Return here as needed.  I have attached [facility] that you can follow-up with regarding recurrent urinary symptoms where they can provide further evaluation and management.      ED Prescriptions   None    PDMP not reviewed this encounter.   Wynonia Lawman A, NP 06/20/23 [phone]

## 2023-06-20 NOTE — Discharge Instructions (Addendum)
 Your results will come back over the next few days and someone will call if results are positive and require treatment.  Return here as needed.  I have attached [facility] that you can follow-up with regarding recurrent urinary symptoms where they can provide further evaluation and management.

## 2023-06-20 NOTE — ED Triage Notes (Signed)
 Onset 2 - 2 1/2 weeks ago of symptoms.  Patient reports urgency, frequency.  When asked about vaginal discharge, patient reports " I dont pay attention to that".  Patient is not drinking as many liquids.  Patient complains of back pain, denies abdominal pain

## 2023-06-21 ENCOUNTER — Telehealth (HOSPITAL_COMMUNITY): Payer: Self-pay

## 2023-06-21 LAB — CERVICOVAGINAL ANCILLARY ONLY
Bacterial Vaginitis (gardnerella): POSITIVE — AB
Candida Glabrata: NEGATIVE
Candida Vaginitis: NEGATIVE
Chlamydia: NEGATIVE
Comment: NEGATIVE
Comment: NEGATIVE
Comment: NEGATIVE
Comment: NEGATIVE
Comment: NEGATIVE
Comment: NORMAL
Neisseria Gonorrhea: NEGATIVE
Trichomonas: NEGATIVE

## 2023-06-21 LAB — URINE CULTURE: Culture: NO GROWTH

## 2023-06-21 MED ORDER — METRONIDAZOLE 500 MG PO TABS: 500.0000 mg | ORAL_TABLET | Freq: Two times a day (BID) | ORAL | 0 refills | Status: AC

## 2023-06-21 NOTE — Telephone Encounter (Signed)
 Per protocol, pt requires tx with metronidazole. Rx sent to pharmacy on file.

## 2023-08-17 ENCOUNTER — Encounter (HOSPITAL_COMMUNITY): Payer: Self-pay

## 2023-08-17 ENCOUNTER — Ambulatory Visit (HOSPITAL_COMMUNITY)
Admission: EM | Admit: 2023-08-17 | Discharge: 2023-08-17 | Disposition: A | Discharge status: Home / Self Care | Attending: Internal Medicine | Admitting: Internal Medicine

## 2023-08-17 DIAGNOSIS — B001 Herpesviral vesicular dermatitis: Secondary | ICD-10-CM | POA: Diagnosis not present

## 2023-08-17 MED ORDER — VALACYCLOVIR HCL 1 G PO TABS: 1000.0000 mg | ORAL_TABLET | Freq: Three times a day (TID) | ORAL | 0 refills | Status: AC

## 2023-08-17 MED ORDER — PREDNISONE 20 MG PO TABS: 40.0000 mg | ORAL_TABLET | Freq: Every day | ORAL | 0 refills | Status: AC

## 2023-08-17 NOTE — Discharge Instructions (Addendum)
 The area on your left lower lip appears to be a cold sore or also known as a fever blister.  This is caused by a virus that is very common. This is not a STD.  This can be treated with a medication called Valtrex.  Given the swelling we can also do a steroid to help improve the swelling faster. We will treat with the following:  Valtrex 1000 mg 3 times daily for 7 days Prednisone 40 mg (2 tablets) once daily for 5 days. Take this in the morning.  This is a steroid to help with inflammation and pain. Return to urgent care or PCP if symptoms worsen or fail to resolve.

## 2023-08-17 NOTE — ED Triage Notes (Signed)
 Pt c/o bottom lip swelling, dry skin, and a bump on it with drainage. States can't open her mouth. NAD

## 2023-08-17 NOTE — ED Provider Notes (Signed)
 [facility]    CSN: [id number] Arrival date & time: 08/17/23  1357      History   Chief Complaint Chief Complaint  Patient presents with   Oral Swelling    HPI Jean Thomas is a 23 y.o. female.   23 year old female who presents urgent care with complaints of left lower lip swelling and small ulceration.  She reports that this started after she got back from the movies with her mom last night.  This was around 930 or 10.  She did not eat anything at the theater.  She has never had anything like this before.  She denies any recent fevers, congestion, injury to the lip, trouble breathing, swelling inside the mouth or other associated symptoms.     History reviewed. No pertinent past medical history.  There are no active problems to display for this patient.   History reviewed. No pertinent surgical history.  OB History   No obstetric history on file.      Home Medications    Prior to Admission medications   Medication Sig Start Date End Date Taking? Authorizing Provider  predniSONE (DELTASONE) 20 MG tablet Take 2 tablets (40 mg total) by mouth daily with breakfast for 5 days. 08/17/23 08/22/23 Yes Arrian Manson A, PA-C  valACYclovir (VALTREX) 1000 MG tablet Take 1 tablet (1,000 mg total) by mouth 3 (three) times daily for 7 days. 08/17/23 08/24/23 Yes Kreg Pesa, PA-C    Family History History reviewed. No pertinent family history.  Social History Social History   Tobacco Use   Smoking status: Never   Smokeless tobacco: Never  Vaping Use   Vaping status: Never Used  Substance Use Topics   Alcohol use: Never   Drug use: Never     Allergies   Peanut-containing drug products   Review of Systems Review of Systems  Constitutional:  Negative for chills and fever.  HENT:  Negative for ear pain and sore throat.        Left lower lip swelling and ulcer  Eyes:  Negative for pain and visual disturbance.  Respiratory:  Negative for cough and  shortness of breath.   Cardiovascular:  Negative for chest pain and palpitations.  Gastrointestinal:  Negative for abdominal pain and vomiting.  Genitourinary:  Negative for dysuria and hematuria.  Musculoskeletal:  Negative for arthralgias and back pain.  Skin:  Negative for color change and rash.  Neurological:  Negative for seizures and syncope.  All other systems reviewed and are negative.    Physical Exam Triage Vital Signs ED Triage Vitals  Encounter Vitals Group     BP 08/17/23 1402 117/80     Systolic BP Percentile --      Diastolic BP Percentile --      Pulse Rate 08/17/23 1402 76     Resp 08/17/23 1402 18     Temp 08/17/23 1402 98.3 F (36.8 C)     Temp Source 08/17/23 1402 Oral     SpO2 08/17/23 1402 99 %     Weight --      Height --      Head Circumference --      Peak Flow --      Pain Score 08/17/23 1404 10     Pain Loc --      Pain Education --      Exclude from Growth Chart --    No data found.  Updated Vital Signs BP 117/80 (BP Location: Right Arm)  Pulse 76   Temp 98.3 F (36.8 C) (Oral)   Resp 18   SpO2 99%   Visual Acuity Right Eye Distance:   Left Eye Distance:   Bilateral Distance:    Right Eye Near:   Left Eye Near:    Bilateral Near:     Physical Exam Vitals and nursing note reviewed.  Constitutional:      General: She is not in acute distress.    Appearance: She is well-developed.  HENT:     Head: Normocephalic and atraumatic.   Eyes:     Conjunctiva/sclera: Conjunctivae normal.  Cardiovascular:     Rate and Rhythm: Normal rate and regular rhythm.     Heart sounds: No murmur heard. Pulmonary:     Effort: Pulmonary effort is normal. No respiratory distress.     Breath sounds: Normal breath sounds.  Abdominal:     Palpations: Abdomen is soft.     Tenderness: There is no abdominal tenderness.  Musculoskeletal:        General: No swelling.     Cervical back: Neck supple.  Skin:    General: Skin is warm and dry.      Capillary Refill: Capillary refill takes less than 2 seconds.  Neurological:     Mental Status: She is alert.  Psychiatric:        Mood and Affect: Mood normal.      UC Treatments / Results  Labs (all labs ordered are listed, but only abnormal results are displayed) Labs Reviewed - No data to display  EKG   Radiology No results found.  Procedures Procedures (including critical care time)  Medications Ordered in UC Medications - No data to display  Initial Impression / Assessment and Plan / UC Course  I have reviewed the triage vital signs and the nursing notes.  Pertinent labs & imaging results that were available during my care of the patient were reviewed by me and considered in my medical decision making (see chart for details).     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 Less likely is an allergic reaction but the patient did not eat anything prior to this occurring.  Reassuringly there is no swelling inside the mouth or difficulty breathing.  We did offer a steroid shot today that the patient declined.  We will treat with  Valtrex.  Given the swelling we we will also treat with a steroid to help improve the swelling faster. We will treat with the following:  Valtrex 1000 mg 3 times daily for 7 days Prednisone 40 mg (2 tablets) once daily for 5 days. Take this in the morning.  This is a steroid to help with inflammation and pain. Return to urgent care or PCP if symptoms worsen or fail to resolve.    Final Clinical Impressions(s) / UC Diagnoses   Final diagnoses:  Cold sore     Discharge Instructions      The area on your left lower lip appears to be a cold sore or also known as a fever blister.  This is caused by a virus that is very common. This is not a STD.  This can be treated with a medication called Valtrex.  Given the swelling we can also do a steroid to help improve the swelling faster. We will treat with the following:   Valtrex 1000 mg 3 times daily for 7 days Prednisone 40 mg (2 tablets) once  daily for 5 days. Take this in the morning.  This is a steroid to help with inflammation and pain. Return to urgent care or PCP if symptoms worsen or fail to resolve.    ED Prescriptions     Medication Sig Dispense Auth. Provider   valACYclovir (VALTREX) 1000 MG tablet Take 1 tablet (1,000 mg total) by mouth 3 (three) times daily for 7 days. 21 tablet Kamelia Lampkins A, PA-C   predniSONE (DELTASONE) 20 MG tablet Take 2 tablets (40 mg total) by mouth daily with breakfast for 5 days. 10 tablet Kreg Pesa, [place]      PDMP not reviewed this encounter.   Kreg Pesa, PA-C 08/17/23 1536

## 2023-10-15 ENCOUNTER — Ambulatory Visit (HOSPITAL_COMMUNITY)
Admission: EM | Admit: 2023-10-15 | Discharge: 2023-10-15 | Disposition: A | Discharge status: Home / Self Care | Attending: Emergency Medicine | Admitting: Emergency Medicine

## 2023-10-15 ENCOUNTER — Encounter (HOSPITAL_COMMUNITY): Payer: Self-pay | Admitting: Emergency Medicine

## 2023-10-15 DIAGNOSIS — R051 Acute cough: Secondary | ICD-10-CM

## 2023-10-15 DIAGNOSIS — U071 COVID-19: Secondary | ICD-10-CM

## 2023-10-15 LAB — POC SARS CORONAVIRUS 2 AG -  ED: SARS Coronavirus 2 Ag: POSITIVE — AB

## 2023-10-15 MED ORDER — AZELASTINE HCL 0.1 % NA SOLN: 2.0000 | Freq: Two times a day (BID) | NASAL | 0 refills | Status: DC

## 2023-10-15 MED ORDER — PROMETHAZINE-DM 6.25-15 MG/5ML PO SYRP: 5.0000 mL | ORAL_SOLUTION | Freq: Every evening | ORAL | 0 refills | Status: DC | PRN

## 2023-10-15 MED ORDER — BENZONATATE 100 MG PO CAPS: 100.0000 mg | ORAL_CAPSULE | Freq: Three times a day (TID) | ORAL | 0 refills | Status: DC

## 2023-10-15 NOTE — ED Triage Notes (Signed)
 Since Wed pt had coughing, sore throat, fevers, and chills. Took Mucinex, salt water gargle, hot tea with honey and nothing helping. Pt has productive cough that is green.

## 2023-10-15 NOTE — Discharge Instructions (Addendum)
 You tested positive for COVID today. You can take Tessalon  every 8 hours as needed for cough. You can also take Promethazine  DM cough syrup at bedtime as needed for cough.  This can make you drowsy so do not drive, work, or drink alcohol while taking this. You can use azelastine  nasal spray twice daily for congestion. You can also continue to take Mucinex for your symptoms. Alternate between 650 mg of Tylenol and 400 mg of ibuprofen every 6-8 hours as needed for sore throat, body aches, and fever. Make sure you are staying hydrated and getting plenty of rest. Follow-up with your primary care or return here as needed.

## 2023-10-15 NOTE — ED Provider Notes (Signed)
 [facility]    CSN: [id number] Arrival date & time: 10/15/23  1308      History   Chief Complaint Chief Complaint  Patient presents with   Cough   Sore Throat    HPI Jean Thomas is a 23 y.o. female.   Patient presents with cough, sore throat, congestion, fever, and chills that began on 7/30.  Patient denies shortness of breath, chest pain, nausea, vomiting, diarrhea, and abdominal pain.  Patient states that she has been taking Mucinex, gargling salt water and hot tea with honey with minimal relief.  Patient reports that she was recently exposed to someone with COVID.  The history is provided by the patient and medical records.  Cough Sore Throat    History reviewed. No pertinent past medical history.  There are no active problems to display for this patient.   Past Surgical History:  Procedure Laterality Date   WISDOM TOOTH EXTRACTION      OB History   No obstetric history on file.      Home Medications    Prior to Admission medications   Medication Sig Start Date End Date Taking? Authorizing Provider  azelastine  (ASTELIN ) 0.1 % nasal spray Place 2 sprays into both nostrils 2 (two) times daily. Use in each nostril as directed 10/15/23  Yes Johnie Flaming A, NP  benzonatate  (TESSALON ) 100 MG capsule Take 1 capsule (100 mg total) by mouth every 8 (eight) hours. 10/15/23  Yes Johnie Flaming A, NP  promethazine -dextromethorphan (PROMETHAZINE -DM) 6.25-15 MG/5ML syrup Take 5 mLs by mouth at bedtime as needed for cough. 10/15/23  Yes Johnie Flaming LABOR, NP    Family History No family history on file.  Social History Social History   Tobacco Use   Smoking status: Never   Smokeless tobacco: Never  Vaping Use   Vaping status: Never Used  Substance Use Topics   Alcohol use: Never   Drug use: Never     Allergies   Peanut-containing drug products   Review of Systems Review of Systems  Respiratory:  Positive for cough.    Per HPI  Physical  Exam Triage Vital Signs ED Triage Vitals  Encounter Vitals Group     BP 10/15/23 1410 105/70     Girls Systolic BP Percentile --      Girls Diastolic BP Percentile --      Boys Systolic BP Percentile --      Boys Diastolic BP Percentile --      Pulse Rate 10/15/23 1410 98     Resp 10/15/23 1410 15     Temp 10/15/23 1410 98.1 F (36.7 C)     Temp Source 10/15/23 1410 Oral     SpO2 10/15/23 1410 100 %     Weight --      Height --      Head Circumference --      Peak Flow --      Pain Score 10/15/23 1408 8     Pain Loc --      Pain Education --      Exclude from Growth Chart --    No data found.  Updated Vital Signs BP 105/70 (BP Location: Left Arm)   Pulse 98   Temp 98.1 F (36.7 C) (Oral)   Resp 15   LMP 09/30/2023 (Exact Date)   SpO2 100%   Visual Acuity Right Eye Distance:   Left Eye Distance:   Bilateral Distance:    Right Eye Near:   Left Eye  Near:    Bilateral Near:     Physical Exam Vitals and nursing note reviewed.  Constitutional:      General: She is awake. She is not in acute distress.    Appearance: Normal appearance. She is well-developed and well-groomed. She is not ill-appearing.  HENT:     Right Ear: Tympanic membrane, ear canal and external ear normal.     Left Ear: Tympanic membrane, ear canal and external ear normal.     Nose: Congestion and rhinorrhea present.     Mouth/Throat:     Mouth: Mucous membranes are moist.     Pharynx: Posterior oropharyngeal erythema and postnasal drip present. No oropharyngeal exudate.  Cardiovascular:     Rate and Rhythm: Normal rate and regular rhythm.  Pulmonary:     Effort: Pulmonary effort is normal.     Breath sounds: Normal breath sounds.  Skin:    General: Skin is warm and dry.  Neurological:     Mental Status: She is alert.  Psychiatric:        Behavior: Behavior is cooperative.      UC Treatments / Results  Labs (all labs ordered are listed, but only abnormal results are displayed) Labs  Reviewed  POC SARS CORONAVIRUS 2 AG -  ED - Abnormal; Notable for the following components:      Result Value   SARS Coronavirus 2 Ag Positive (*)    All other components within normal limits    EKG   Radiology No results found.  Procedures Procedures (including critical care time)  Medications Ordered in UC Medications - No data to display  Initial Impression / Assessment and Plan / UC Course  I have reviewed the triage vital signs and the nursing notes.  Pertinent labs & imaging results that were available during my care of the patient were reviewed by me and considered in my medical decision making (see chart for details).     Patient is overall well-appearing.  Vitals are stable.  Congestion and rhinorrhea are present, mild erythema and PND noted to pharynx.  Lungs clear bilaterally to auscultation.  COVID testing positive.  Prescribed Tessalon  and promethazine  DM as needed for cough.  Prescribed azelastine  nasal spray for congestion.  Discussed over-the-counter medications for symptoms.  Discussed follow-up and return precautions. Final Clinical Impressions(s) / UC Diagnoses   Final diagnoses:  Acute cough  COVID-19     Discharge Instructions      You tested positive for COVID today. You can take Tessalon  every 8 hours as needed for cough. You can also take Promethazine  DM cough syrup at bedtime as needed for cough.  This can make you drowsy so do not drive, work, or drink alcohol while taking this. You can use azelastine  nasal spray twice daily for congestion. You can also continue to take Mucinex for your symptoms. Alternate between 650 mg of Tylenol and 400 mg of ibuprofen every 6-8 hours as needed for sore throat, body aches, and fever. Make sure you are staying hydrated and getting plenty of rest. Follow-up with your primary care or return here as needed.   ED Prescriptions     Medication Sig Dispense Auth. Provider   benzonatate  (TESSALON ) 100 MG  capsule Take 1 capsule (100 mg total) by mouth every 8 (eight) hours. 21 capsule Johnie Flaming A, NP   promethazine -dextromethorphan (PROMETHAZINE -DM) 6.25-15 MG/5ML syrup Take 5 mLs by mouth at bedtime as needed for cough. 118 mL Johnie Flaming A, NP   azelastine  (ASTELIN )  0.1 % nasal spray Place 2 sprays into both nostrils 2 (two) times daily. Use in each nostril as directed 30 mL Johnie Flaming A, NP      PDMP not reviewed this encounter.   Johnie Flaming A, NP 10/15/23 1517

## 2023-10-19 ENCOUNTER — Encounter: Admitting: Obstetrics and Gynecology

## 2023-11-21 ENCOUNTER — Encounter (HOSPITAL_COMMUNITY): Payer: Self-pay

## 2023-11-21 ENCOUNTER — Emergency Department (HOSPITAL_COMMUNITY)
Admission: EM | Admit: 2023-11-21 | Discharge: 2023-11-21 | Disposition: A | Discharge status: Home / Self Care | Attending: Emergency Medicine | Admitting: Emergency Medicine

## 2023-11-21 ENCOUNTER — Other Ambulatory Visit: Payer: Self-pay

## 2023-11-21 DIAGNOSIS — Z91010 Allergy to peanuts: Secondary | ICD-10-CM | POA: Diagnosis not present

## 2023-11-21 DIAGNOSIS — R350 Frequency of micturition: Secondary | ICD-10-CM | POA: Insufficient documentation

## 2023-11-21 LAB — I-STAT CHEM 8, ED
BUN: 17 mg/dL (ref 6–20)
Calcium, Ion: 1.1 mmol/L — ABNORMAL LOW (ref 1.15–1.40)
Chloride: 106 mmol/L (ref 98–111)
Creatinine, Ser: 0.7 mg/dL (ref 0.44–1.00)
Glucose, Bld: 80 mg/dL (ref 70–99)
HCT: 41 % (ref 36.0–46.0)
Hemoglobin: 13.9 g/dL (ref 12.0–15.0)
Potassium: 4 mmol/L (ref 3.5–5.1)
Sodium: 137 mmol/L (ref 135–145)
TCO2: 22 mmol/L (ref 22–32)

## 2023-11-21 LAB — URINALYSIS, ROUTINE W REFLEX MICROSCOPIC
Bacteria, UA: NONE SEEN
Bilirubin Urine: NEGATIVE
Glucose, UA: NEGATIVE mg/dL
Hgb urine dipstick: NEGATIVE
Ketones, ur: NEGATIVE mg/dL
Nitrite: NEGATIVE
Protein, ur: NEGATIVE mg/dL
Specific Gravity, Urine: 1.021 (ref 1.005–1.030)
pH: 7 (ref 5.0–8.0)

## 2023-11-21 LAB — CBC WITH DIFFERENTIAL/PLATELET
Abs Immature Granulocytes: 0.01 K/uL (ref 0.00–0.07)
Basophils Absolute: 0 K/uL (ref 0.0–0.1)
Basophils Relative: 0 %
Eosinophils Absolute: 0.1 K/uL (ref 0.0–0.5)
Eosinophils Relative: 1 %
HCT: 41.5 % (ref 36.0–46.0)
Hemoglobin: 13.4 g/dL (ref 12.0–15.0)
Immature Granulocytes: 0 %
Lymphocytes Relative: 31 %
Lymphs Abs: 1.6 K/uL (ref 0.7–4.0)
MCH: 27.6 pg (ref 26.0–34.0)
MCHC: 32.3 g/dL (ref 30.0–36.0)
MCV: 85.6 fL (ref 80.0–100.0)
Monocytes Absolute: 0.5 K/uL (ref 0.1–1.0)
Monocytes Relative: 10 %
Neutro Abs: 3 K/uL (ref 1.7–7.7)
Neutrophils Relative %: 58 %
Platelets: 145 K/uL — ABNORMAL LOW (ref 150–400)
RBC: 4.85 MIL/uL (ref 3.87–5.11)
RDW: 12.5 % (ref 11.5–15.5)
WBC: 5.3 K/uL (ref 4.0–10.5)
nRBC: 0 % (ref 0.0–0.2)

## 2023-11-21 LAB — HCG, SERUM, QUALITATIVE: Preg, Serum: NEGATIVE

## 2023-11-21 MED ORDER — OXYBUTYNIN CHLORIDE ER 5 MG PO TB24: 5.0000 mg | ORAL_TABLET | Freq: Every day | ORAL | 1 refills | Status: DC

## 2023-11-21 NOTE — ED Notes (Signed)
 Extra DG & LG drawn

## 2023-11-21 NOTE — ED Triage Notes (Signed)
 C/O urinary frequency for months. Denies burning when urinating. C/O lower abd pain. Denies being diabetic. Denies increase in thirst.

## 2023-11-21 NOTE — ED Provider Notes (Signed)
 [facility] Provider Note   CSN: [id number] Arrival date & time: 11/21/23  1522     Patient presents with: Urinary Frequency   Jean Thomas is a 23 y.o. female.    Urinary Frequency   This patient is a otherwise healthy 23 year old female who states that for the last year she has had urinary frequency where she goes multiple times throughout the day in the nighttime, she feels like she is always urinating and can never hold it.  When she is at work at [employer] where she works in [employer] window she has to hold her urine for the better part of an entire shift which makes it very difficult for her.  She never has burning, she has been tested for STDs at the urgent care, nobody is giving her an answer.  She has a follow-up with urology in December.  No abdominal pain no back pain no fevers or chills  I reviewed the medical record and it appears that she had urinary frequency in April of this year during which time she had urine samples and STD testing done,      Prior to Admission medications   Medication Sig Start Date End Date Taking? Authorizing Provider  oxybutynin  (DITROPAN  XL) 5 MG 24 hr tablet Take 1 tablet (5 mg total) by mouth at bedtime. 11/21/23  Yes Cleotilde Rogue, MD  azelastine  (ASTELIN ) 0.1 % nasal spray Place 2 sprays into both nostrils 2 (two) times daily. Use in each nostril as directed 10/15/23   Johnie Flaming A, NP  benzonatate  (TESSALON ) 100 MG capsule Take 1 capsule (100 mg total) by mouth every 8 (eight) hours. 10/15/23   Johnie Flaming A, NP  promethazine -dextromethorphan (PROMETHAZINE -DM) 6.25-15 MG/5ML syrup Take 5 mLs by mouth at bedtime as needed for cough. 10/15/23   Johnie Flaming LABOR, NP    Allergies: Peanut-containing drug products    Review of Systems  Genitourinary:  Positive for frequency.  All other systems reviewed and are negative.   Updated Vital Signs BP 124/82   Pulse 99   Temp  98.2 F (36.8 C)   Resp 14   Ht 1.727 m (5' 8)   LMP 11/03/2023   SpO2 97%   BMI 16.53 kg/m   Physical Exam Vitals and nursing note reviewed.  Constitutional:      General: She is not in acute distress.    Appearance: She is well-developed.  HENT:     Head: Normocephalic and atraumatic.     Mouth/Throat:     Pharynx: No oropharyngeal exudate.  Eyes:     General: No scleral icterus.       Right eye: No discharge.        Left eye: No discharge.     Conjunctiva/sclera: Conjunctivae normal.     Pupils: Pupils are equal, round, and reactive to light.  Neck:     Thyroid: No thyromegaly.     Vascular: No JVD.  Cardiovascular:     Rate and Rhythm: Normal rate and regular rhythm.     Heart sounds: Normal heart sounds. No murmur heard.    No friction rub. No gallop.  Pulmonary:     Effort: Pulmonary effort is normal. No respiratory distress.     Breath sounds: Normal breath sounds. No wheezing or rales.  Abdominal:     General: Bowel sounds are normal. There is no distension.     Palpations: Abdomen is soft. There is no mass.  Tenderness: There is no abdominal tenderness.  Musculoskeletal:        General: No tenderness. Normal range of motion.     Cervical back: Normal range of motion and neck supple.  Lymphadenopathy:     Cervical: No cervical adenopathy.  Skin:    General: Skin is warm and dry.     Findings: No erythema or rash.  Neurological:     Mental Status: She is alert.     Coordination: Coordination normal.  Psychiatric:        Behavior: Behavior normal.     (all labs ordered are listed, but only abnormal results are displayed) Labs Reviewed  CBC WITH DIFFERENTIAL/PLATELET - Abnormal; Notable for the following components:      Result Value   Platelets 145 (*)    All other components within normal limits  URINALYSIS, ROUTINE W REFLEX MICROSCOPIC - Abnormal; Notable for the following components:   APPearance HAZY (*)    Leukocytes,Ua MODERATE (*)    All  other components within normal limits  I-STAT CHEM 8, ED - Abnormal; Notable for the following components:   Calcium, Ion 1.10 (*)    All other components within normal limits  HCG, SERUM, QUALITATIVE    EKG: None  Radiology: No results found.   Procedures   Medications Ordered in the ED - No data to display                                  Medical Decision Making Amount and/or Complexity of Data Reviewed Labs: ordered.  Risk Prescription drug management.    This patient presents to the ED for concern of urinary frequency differential diagnosis includes interstitial cystitis, UTI, no other acute or severe concerns    Additional history obtained   Additional history obtained from Electronic Medical Record External records from outside source obtained and reviewed including medical record including prior labs and results   Lab Tests:  I Ordered, and personally interpreted labs.  The pertinent results include: Urine sample, pregnancy test, CBC and metabolic panel all unremarkable, no glucose urea   Medicines ordered and prescription drug management:  I ordered medication including home with Ditropan     I have reviewed the patients home medicines and have made adjustments as needed   Problem List / ED Course:  Home with Ditropan , follow-up with urology which is already established   Social Determinants of Health:  None        Final diagnoses:  Urinary frequency    ED Discharge Orders          Ordered    oxybutynin  (DITROPAN  XL) 5 MG 24 hr tablet  Daily at bedtime        11/21/23 1903               Cleotilde Rogue, MD 11/21/23 1910

## 2023-11-21 NOTE — ED Triage Notes (Signed)
 PT presents to ED for urinary frequency and  7-8/10 bladder pain x several months worsening over the last 3 weeks.

## 2023-11-21 NOTE — Discharge Instructions (Signed)
 Ditropan  at night before bed  See the urologist in your appointment, try to make a sooner appointment if they have a call out you might be able to be worked into the clinic sooner.  Your testing here was normal

## 2024-01-24 ENCOUNTER — Emergency Department (HOSPITAL_COMMUNITY)
Admission: EM | Admit: 2024-01-24 | Discharge: 2024-01-24 | Disposition: A | Discharge status: Home / Self Care | Attending: Emergency Medicine | Admitting: Emergency Medicine

## 2024-01-24 ENCOUNTER — Other Ambulatory Visit: Payer: Self-pay

## 2024-01-24 ENCOUNTER — Encounter (HOSPITAL_COMMUNITY): Payer: Self-pay

## 2024-01-24 DIAGNOSIS — B9689 Other specified bacterial agents as the cause of diseases classified elsewhere: Secondary | ICD-10-CM | POA: Diagnosis not present

## 2024-01-24 DIAGNOSIS — B009 Herpesviral infection, unspecified: Secondary | ICD-10-CM | POA: Insufficient documentation

## 2024-01-24 DIAGNOSIS — Z91010 Allergy to peanuts: Secondary | ICD-10-CM | POA: Insufficient documentation

## 2024-01-24 DIAGNOSIS — R1030 Lower abdominal pain, unspecified: Secondary | ICD-10-CM | POA: Diagnosis present

## 2024-01-24 DIAGNOSIS — N390 Urinary tract infection, site not specified: Secondary | ICD-10-CM | POA: Insufficient documentation

## 2024-01-24 DIAGNOSIS — B001 Herpesviral vesicular dermatitis: Secondary | ICD-10-CM

## 2024-01-24 LAB — URINALYSIS, ROUTINE W REFLEX MICROSCOPIC
Bilirubin Urine: NEGATIVE
Glucose, UA: NEGATIVE mg/dL
Hgb urine dipstick: NEGATIVE
Ketones, ur: NEGATIVE mg/dL
Nitrite: NEGATIVE
Protein, ur: NEGATIVE mg/dL
Specific Gravity, Urine: 1.017 (ref 1.005–1.030)
pH: 5 (ref 5.0–8.0)

## 2024-01-24 LAB — WET PREP, GENITAL
Clue Cells Wet Prep HPF POC: NONE SEEN
Sperm: NONE SEEN
Trich, Wet Prep: NONE SEEN
WBC, Wet Prep HPF POC: >=10 — AB (ref <10)
Yeast Wet Prep HPF POC: NONE SEEN

## 2024-01-24 MED ORDER — VALACYCLOVIR HCL 500 MG PO TABS
1000.0000 mg | ORAL_TABLET | Freq: Once | ORAL | Status: AC
  Administered 2024-01-24: 1000 mg via ORAL
  Filled 2024-01-24: qty 2

## 2024-01-24 MED ORDER — CEPHALEXIN 500 MG PO CAPS: 500.0000 mg | ORAL_CAPSULE | Freq: Two times a day (BID) | ORAL | 0 refills | Status: AC

## 2024-01-24 MED ORDER — VALACYCLOVIR HCL 1 G PO TABS: 2000.0000 mg | ORAL_TABLET | Freq: Two times a day (BID) | ORAL | 0 refills | Status: AC

## 2024-01-24 MED ORDER — CEPHALEXIN 250 MG PO CAPS
500.0000 mg | ORAL_CAPSULE | Freq: Once | ORAL | Status: AC
  Administered 2024-01-24: 500 mg via ORAL
  Filled 2024-01-24: qty 2

## 2024-01-24 NOTE — ED Triage Notes (Signed)
 Here by POV from home for urinary frequency, and bladder irritation, gradually progressively worse over last month, and LL lip lesion noticed yesterday. Alert, NAD, calm, interactive, resps e/u, speaking in clear complete sentences. Skin W&D. Steady gait.

## 2024-01-24 NOTE — ED Provider Notes (Signed)
 [place] EM[facility] DEPARTMENT AT [facility] Provider Note   CSN: [id number] Arrival date & time: 01/24/24  1440     Patient presents with: Urinary Frequency   Jean Thomas is a 23 y.o. female.   23 yo female presents with complaint of a sore on her left lower lip that has been present for a few days, tender to the touch, no history of other lesions. Also dysuria and frequency with suprapubic discomfort.  Denies fever, vomiting, vaginal discharge or other complaints or concerns.       Prior to Admission medications   Medication Sig Start Date End Date Taking? Authorizing Provider  azelastine  (ASTELIN ) 0.1 % nasal spray Place 2 sprays into both nostrils 2 (two) times daily. Use in each nostril as directed 10/15/23   Johnie Flaming A, NP  benzonatate  (TESSALON ) 100 MG capsule Take 1 capsule (100 mg total) by mouth every 8 (eight) hours. 10/15/23   Johnie Flaming A, NP  cephALEXin (KEFLEX) 500 MG capsule Take 1 capsule (500 mg total) by mouth 2 (two) times daily for 5 days. 01/24/24 01/29/24 Yes Beverley Leita LABOR, PA-C  oxybutynin  (DITROPAN  XL) 5 MG 24 hr tablet Take 1 tablet (5 mg total) by mouth at bedtime. 11/21/23   Cleotilde Rogue, MD  promethazine -dextromethorphan (PROMETHAZINE -DM) 6.25-15 MG/5ML syrup Take 5 mLs by mouth at bedtime as needed for cough. 10/15/23   Johnie Flaming A, NP  valACYclovir  (VALTREX ) 1000 MG tablet Take 2 tablets (2,000 mg total) by mouth 2 (two) times daily for 1 day. Take dose tomorrow morning, repeat dose 12 hours later 01/24/24 01/25/24 Yes Beverley Leita LABOR, PA-C    Allergies: Peanut-containing drug products    Review of Systems Negative except as per HPI Updated Vital Signs BP 90/64   Pulse 92   Temp 97.9 F (36.6 C)   Resp 18   Ht 5' 8 (1.727 m)   Wt 54.4 kg   LMP 01/03/2024 (Approximate)   SpO2 96%   BMI 18.25 kg/m   Physical Exam Vitals and nursing note reviewed.  Constitutional:      General: She is not in acute  distress.    Appearance: She is well-developed. She is not diaphoretic.  HENT:     Head: Normocephalic and atraumatic.     Comments: Small vesicle to left lower lip Pulmonary:     Effort: Pulmonary effort is normal.  Abdominal:     Palpations: Abdomen is soft.     Tenderness: There is abdominal tenderness in the suprapubic area. There is no right CVA tenderness, left CVA tenderness, guarding or rebound.     Comments: Mild tenderness over suprapubic area without guarding or rebound  Skin:    General: Skin is warm and dry.     Findings: No erythema.  Neurological:     Mental Status: She is alert and oriented to person, place, and time.  Psychiatric:        Behavior: Behavior normal.     (all labs ordered are listed, but only abnormal results are displayed) Labs Reviewed  WET PREP, GENITAL - Abnormal; Notable for the following components:      Result Value   WBC, Wet Prep HPF POC >=10 (*)    All other components within normal limits  URINALYSIS, ROUTINE W REFLEX MICROSCOPIC - Abnormal; Notable for the following components:   APPearance HAZY (*)    Leukocytes,Ua MODERATE (*)    Bacteria, UA RARE (*)    All other components within normal  limits  POC URINE PREG, ED  GC/CHLAMYDIA PROBE AMP ([place]) NOT AT [facility]    EKG: None  Radiology: No results found.   Procedures   Medications Ordered in the ED  valACYclovir  (VALTREX ) tablet 1,000 mg (has no administration in time range)  cephALEXin (KEFLEX) capsule 500 mg (has no administration in time range)                                    Medical Decision Making  23 year old female with complaint of left lower lip lesion and dysuria/frequency.  Found to have likely cold sore to left lower lip, will treat with Valtrex .  Also likely urinary tract infection given symptoms with moderate leukocytes in her urine.  Provided with Keflex for urinary tract infection.  Wet prep is negative for yeast, trichomoniasis, BV.  GC chlamydia  sent out, patient to follow-up in MyChart for results and present to health department if needed for treatment.  hCG not resulted in chart but verified with lab as negative. Recommend follow-up with primary care provider.  Provided with  and wellness if she does not have PCP for follow-up.     Final diagnoses:  Urinary tract infection in female  Cold sore    ED Discharge Orders          Ordered    cephALEXin (KEFLEX) 500 MG capsule  2 times daily        01/24/24 2258    valACYclovir  (VALTREX ) 1000 MG tablet  2 times daily        01/24/24 2258               Beverley Leita LABOR, PA-C 01/24/24 2304    Haze Lonni PARAS, MD 01/26/24 [phone]

## 2024-01-24 NOTE — ED Provider Triage Note (Signed)
 Emergency Medicine Provider Triage Evaluation Note  Jean Thomas , a 23 y.o. female  was evaluated in triage.  Pt complains of suprapubic abdominal pain and urinary frequency. States that she has not been sexually active in months but would like to still be check for STDs, but declined testing for HIV or syphilis. Denies genital rashes or lesions but states she does have a sore on her lip. Denies fever, chills, nausea, vomiting. Has never had a UTI before.  Review of Systems  Positive: Suprapubic abdominal pain, urinary frequency Negative: Fever, chills, chest pain, shortness of breath, flank pain  Physical Exam  BP 106/75   Pulse 80   Temp 98.3 F (36.8 C) (Oral)   Resp 18   Ht 5' 8 (1.727 m)   Wt 54.4 kg   LMP 01/03/2024 (Approximate)   SpO2 100%   BMI 18.25 kg/m  Gen:   Awake, no distress   Resp:  Normal effort  MSK:   Moves extremities without difficulty  Other:  Suprapubic abdominal tenderness, abdomen soft, non-distended  Medical Decision Making  Medically screening exam initiated at 3:58 PM.  Appropriate orders placed.  Jean Thomas was informed that the remainder of the evaluation will be completed by another provider, this initial triage assessment does not replace that evaluation, and the importance of remaining in the ED until their evaluation is complete.  Orders: UA, POC urine preg, wet prep, G/C probe    Janetta Terrall FALCON, PA-C 01/24/24 1601

## 2024-01-24 NOTE — Discharge Instructions (Signed)
 Follow up with primary care provider. Please call [place] [facility] to schedule if you do not have a primary care provider.  Take Valtrex  as prescribed. This medicine does not make the cold sore go away in 1 day but will hopefully shorten the duration and pain associated with the cold sore.  Take Keflex as prescribed and complete the full course.  Follow up in your MyChart account for the results of your Gonorrhea and Chlamydia testing. IF either of these tests are positive, please go to the health department for treatment.

## 2024-01-25 LAB — GC/CHLAMYDIA PROBE AMP (~~LOC~~) NOT AT ARMC
Chlamydia: NEGATIVE
Comment: NEGATIVE
Comment: NORMAL
Neisseria Gonorrhea: NEGATIVE

## 2024-01-25 LAB — POC URINE PREG, ED: Preg Test, Ur: NEGATIVE

## 2024-03-11 ENCOUNTER — Other Ambulatory Visit: Payer: Self-pay

## 2024-03-11 ENCOUNTER — Ambulatory Visit (HOSPITAL_COMMUNITY)
Admission: EM | Admit: 2024-03-11 | Discharge: 2024-03-11 | Disposition: A | Discharge status: Home / Self Care | Attending: Physician Assistant | Admitting: Physician Assistant

## 2024-03-11 ENCOUNTER — Encounter (HOSPITAL_COMMUNITY): Payer: Self-pay | Admitting: *Deleted

## 2024-03-11 DIAGNOSIS — J40 Bronchitis, not specified as acute or chronic: Secondary | ICD-10-CM | POA: Diagnosis not present

## 2024-03-11 DIAGNOSIS — J329 Chronic sinusitis, unspecified: Secondary | ICD-10-CM

## 2024-03-11 LAB — POC COVID19/FLU A&B COMBO
Covid Antigen, POC: NEGATIVE
Influenza A Antigen, POC: NEGATIVE
Influenza B Antigen, POC: NEGATIVE

## 2024-03-11 MED ORDER — PROMETHAZINE-DM 6.25-15 MG/5ML PO SYRP: 5.0000 mL | ORAL_SOLUTION | Freq: Two times a day (BID) | ORAL | 0 refills | Status: AC | PRN

## 2024-03-11 MED ORDER — AMOXICILLIN-POT CLAVULANATE 400-57 MG/5ML PO SUSR: 875.0000 mg | Freq: Two times a day (BID) | ORAL | 0 refills | Status: AC

## 2024-03-11 MED ORDER — ACETAMINOPHEN 325 MG PO TABS
ORAL_TABLET | ORAL | Status: AC
  Filled 2024-03-11: qty 3

## 2024-03-11 MED ORDER — ACETAMINOPHEN 325 MG PO TABS
975.0000 mg | ORAL_TABLET | Freq: Once | ORAL | Status: AC
  Administered 2024-03-11: 975 mg via ORAL

## 2024-03-11 MED ORDER — FLUTICASONE PROPIONATE 50 MCG/ACT NA SUSP: 1.0000 | Freq: Every day | NASAL | 0 refills | Status: AC

## 2024-03-11 NOTE — ED Provider Notes (Signed)
 " [facility]    CSN: [id number] Arrival date & time: 03/11/24  1559      History   Chief Complaint Chief Complaint  Patient presents with   Nasal Congestion   Sore Throat   Fever    HPI Jean Thomas is a 23 y.o. female.   Patient presents today with a weeklong history of URI symptoms.  She reports cough, congestion, postnasal drainage.  Earlier today she developed a fever prompting evaluation.  She initially was improving until her symptoms worsened today.  Reports several household sick contacts who have had upper respiratory infections and other viral illnesses.  Denies any specific exposure to COVID or flu.  She has taken Mucinex as well as vitamin C without improvement of symptoms.  She denies any recent antibiotics or steroids.  She is confident that she is not pregnant.      History reviewed. No pertinent past medical history.  There are no active problems to display for this patient.   Past Surgical History:  Procedure Laterality Date   WISDOM TOOTH EXTRACTION      OB History   No obstetric history on file.      Home Medications    Prior to Admission medications  Medication Sig Start Date End Date Taking? Authorizing Provider  amoxicillin -clavulanate (AUGMENTIN ) 400-57 MG/5ML suspension Take 10.9 mLs (875 mg total) by mouth 2 (two) times daily for 7 days. 03/11/24 03/18/24 Yes Haruo Stepanek K, PA-C  fluticasone  (FLONASE ) 50 MCG/ACT nasal spray Place 1 spray into both nostrils daily. 03/11/24  Yes Delvis Kau K, PA-C  promethazine -dextromethorphan (PROMETHAZINE -DM) 6.25-15 MG/5ML syrup Take 5 mLs by mouth 2 (two) times daily as needed for cough. 03/11/24   Raden Byington, Rocky POUR, PA-C    Family History History reviewed. No pertinent family history.  Social History Social History[1]   Allergies   Peanut-containing drug products   Review of Systems Review of Systems  Constitutional:  Positive for activity change and fever. Negative for appetite  change and fatigue.  HENT:  Positive for congestion and postnasal drip. Negative for sinus pressure, sneezing and sore throat.   Respiratory:  Positive for cough. Negative for shortness of breath.   Cardiovascular:  Negative for chest pain.  Gastrointestinal:  Negative for abdominal pain, diarrhea, nausea and vomiting.  Neurological:  Positive for headaches. Negative for dizziness and light-headedness.     Physical Exam Triage Vital Signs ED Triage Vitals [03/11/24 1715]  Encounter Vitals Group     BP 106/71     Girls Systolic BP Percentile      Girls Diastolic BP Percentile      Boys Systolic BP Percentile      Boys Diastolic BP Percentile      Pulse Rate (!) 105     Resp 20     Temp 99.3 F (37.4 C)     Temp src      SpO2 100 %     Weight      Height      Head Circumference      Peak Flow      Pain Score      Pain Loc      Pain Education      Exclude from Growth Chart    No data found.  Updated Vital Signs BP 105/69 (BP Location: Right Arm)   Pulse 96   Temp 98.4 F (36.9 C) (Oral)   Resp 18   LMP 03/01/2024   SpO2 95%  Visual Acuity Right Eye Distance:   Left Eye Distance:   Bilateral Distance:    Right Eye Near:   Left Eye Near:    Bilateral Near:     Physical Exam Vitals reviewed.  Constitutional:      General: She is awake. She is not in acute distress.    Appearance: Normal appearance. She is well-developed. She is not ill-appearing.     Comments: Very pleasant female appears stated age in no acute distress sitting comfortably in exam room  HENT:     Head: Normocephalic and atraumatic.     Right Ear: Tympanic membrane, ear canal and external ear normal. Tympanic membrane is not erythematous or bulging.     Left Ear: Tympanic membrane, ear canal and external ear normal. Tympanic membrane is not erythematous or bulging.     Nose:     Right Sinus: Maxillary sinus tenderness present. No frontal sinus tenderness.     Left Sinus: Maxillary sinus  tenderness present. No frontal sinus tenderness.     Mouth/Throat:     Pharynx: Uvula midline. Postnasal drip present. No oropharyngeal exudate or posterior oropharyngeal erythema.  Cardiovascular:     Rate and Rhythm: Regular rhythm. Tachycardia present.     Heart sounds: Normal heart sounds, S1 normal and S2 normal. No murmur heard. Pulmonary:     Effort: Pulmonary effort is normal.     Breath sounds: Normal breath sounds. No wheezing, rhonchi or rales.     Comments: Clear to auscultation bilaterally Psychiatric:        Behavior: Behavior is cooperative.      UC Treatments / Results  Labs (all labs ordered are listed, but only abnormal results are displayed) Labs Reviewed  POC COVID19/FLU A&B COMBO    EKG   Radiology No results found.  Procedures Procedures (including critical care time)  Medications Ordered in UC Medications  acetaminophen  (TYLENOL ) tablet 975 mg (975 mg Oral Given 03/11/24 1839)    Initial Impression / Assessment and Plan / UC Course  I have reviewed the triage vital signs and the nursing notes.  Pertinent labs & imaging results that were available during my care of the patient were reviewed by me and considered in my medical decision making (see chart for details).     Patient was initially tachycardic but otherwise well-appearing, nontoxic, afebrile.  COVID and flu testing was obtained during triage and was negative.  Chest x-ray was deferred as she had no adventitious lung sounds on exam and her oxygen saturation was appropriate.  Given her prolonged and recent worsening symptoms will cover for second bacterial infection with Augmentin  twice daily for 7 days.  She requested a liquid formulation of the medication.  She does not have any birth control that she is currently taking.  Also recommended fluticasone  nasal spray, nasal saline/sinus rinses, over-the-counter medicines for additional symptom relief.  She was given Promethazine  DM for cough and  we discussed that this can be sedating and she is not to drive or drink alcohol with taking it.  If she is not feeling better within 5 to 7 days she is to return for reevaluation.  If she has any worsening symptoms she needs to be seen immediately.  Return precautions given.  Excuse note provided.  All questions were answered to patient satisfaction and we discussed medications including potential side effects at length.  Final Clinical Impressions(s) / UC Diagnoses   Final diagnoses:  Sinobronchitis     Discharge Instructions  You were negative for COVID and flu.  I am concerned that you have an infection in your sinuses that is causing your symptoms.  Start Augmentin  twice daily for 7 days.  I also recommend nasal saline/sinus rinses, Mucinex, drinking lots of water.  Use fluticasone  nasal spray daily for the next 2 weeks.  Take Promethazine  DM for cough.  This will make you sleepy so do not drive or drink alcohol with taking it.  If you are not feeling better within 5 to 7 days please return.  If anything worsens and you have high fever, worsening cough, shortness of breath, chest pain, weakness you need to be seen immediately.     ED Prescriptions     Medication Sig Dispense Auth. Provider   promethazine -dextromethorphan (PROMETHAZINE -DM) 6.25-15 MG/5ML syrup Take 5 mLs by mouth 2 (two) times daily as needed for cough. 118 mL Khadeejah Castner K, PA-C   fluticasone  (FLONASE ) 50 MCG/ACT nasal spray Place 1 spray into both nostrils daily. 16 g Charmion Hapke K, PA-C   amoxicillin -clavulanate (AUGMENTIN ) 400-57 MG/5ML suspension Take 10.9 mLs (875 mg total) by mouth 2 (two) times daily for 7 days. 152.6 mL Helga Asbury K, PA-C      PDMP not reviewed this encounter.    [1]  Social History Tobacco Use   Smoking status: Never   Smokeless tobacco: Never  Vaping Use   Vaping status: Never Used  Substance Use Topics   Alcohol use: Never   Drug use: Never     Sherrell Rocky POUR,  PA-C 03/11/24 1935  "

## 2024-03-11 NOTE — ED Triage Notes (Signed)
 PT reports Cough ,congestion started on 12-21 . Pt had a fever today.

## 2024-03-11 NOTE — Discharge Instructions (Signed)
 You were negative for COVID and flu.  I am concerned that you have an infection in your sinuses that is causing your symptoms.  Start Augmentin  twice daily for 7 days.  I also recommend nasal saline/sinus rinses, Mucinex, drinking lots of water.  Use fluticasone  nasal spray daily for the next 2 weeks.  Take Promethazine  DM for cough.  This will make you sleepy so do not drive or drink alcohol with taking it.  If you are not feeling better within 5 to 7 days please return.  If anything worsens and you have high fever, worsening cough, shortness of breath, chest pain, weakness you need to be seen immediately.

## 2024-04-10 NOTE — Telephone Encounter (Signed)
 Dr. WARD is not in the clinic on Thursdays or Fridays. I LVM for patient to call back to discuss r/s in [place] and [place].   Thanks!

## 2024-04-10 NOTE — Telephone Encounter (Signed)
 Patient is calling clinic to reschedule 1/27 appointment- LVM for patient to contact clinic to r/s to virtual/phone or move to another day. return in about 4 weeks (around 04/03/2024) for IC f/u . She states Thursday or Friday would work good.  Please Advise  Callback for patient [phone]

## 2024-04-13 ENCOUNTER — Encounter (HOSPITAL_COMMUNITY): Payer: Self-pay

## 2024-04-13 ENCOUNTER — Ambulatory Visit (HOSPITAL_COMMUNITY)
Admission: RE | Admit: 2024-04-13 | Discharge: 2024-04-13 | Disposition: A | Discharge status: Home / Self Care | Source: Ambulatory Visit | Attending: Emergency Medicine | Admitting: Emergency Medicine

## 2024-04-13 VITALS — BP 105/66 | HR 79 | Temp 97.7°F | Resp 18

## 2024-04-13 DIAGNOSIS — Z113 Encounter for screening for infections with a predominantly sexual mode of transmission: Secondary | ICD-10-CM | POA: Insufficient documentation

## 2024-04-13 LAB — POCT URINE DIPSTICK
Bilirubin, UA: NEGATIVE
Blood, UA: NEGATIVE
Glucose, UA: NEGATIVE mg/dL
Ketones, POC UA: NEGATIVE mg/dL
Leukocytes, UA: NEGATIVE
Nitrite, UA: NEGATIVE
POC PROTEIN,UA: NEGATIVE
Spec Grav, UA: 1.02
Urobilinogen, UA: 0.2 U/dL
pH, UA: 7

## 2024-04-13 LAB — POCT URINE PREGNANCY: Preg Test, Ur: NEGATIVE

## 2024-04-13 NOTE — ED Provider Notes (Signed)
 " [facility]    CSN: [id number] Arrival date & time: 04/13/24  1635      History   Chief Complaint Chief Complaint  Patient presents with   Appointment   SEXUALLY TRANSMITTED DISEASE    HPI Jean Thomas is a 24 y.o. female who presents to urgent care today for concerns of STI testing.  She reports that she is sexually active with a new partner and is requesting STI testing.  She has no acute symptoms at this time such as dysuria, increased urinary frequency or urgency, vaginal discharge or drainage, or discharge.  She also denies any fever, chills, body aches, or generalized weakness.  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  HPI  History reviewed. No pertinent past medical history.  There are no active problems to display for this patient.   Past Surgical History:  Procedure Laterality Date   WISDOM TOOTH EXTRACTION      OB History   No obstetric history on file.      Home Medications    Prior to Admission medications  Medication Sig Start Date End Date Taking? Authorizing Provider  fluticasone  (FLONASE ) 50 MCG/ACT nasal spray Place 1 spray into both nostrils daily. 03/11/24   Raspet, Erin K, PA-C  promethazine -dextromethorphan (PROMETHAZINE -DM) 6.25-15 MG/5ML syrup Take 5 mLs by mouth 2 (two) times daily as needed for cough. 03/11/24   Raspet, Rocky POUR, PA-C    Family History History reviewed. No pertinent family history.  Social History Social History[1]   Allergies   Peanut-containing drug products   Review of Systems Review of Systems  All other systems reviewed and are negative.    Physical Exam Triage Vital Signs ED Triage Vitals  Encounter Vitals Group     BP 04/13/24 1712 105/66     Girls Systolic BP Percentile --      Girls Diastolic BP Percentile --      Boys Systolic BP Percentile --      Boys Diastolic BP Percentile --      Pulse Rate 04/13/24 1712 79     Resp 04/13/24 1712 18     Temp  04/13/24 1712 97.7 F (36.5 C)     Temp Source 04/13/24 1712 Oral     SpO2 04/13/24 1712 99 %     Weight --      Height --      Head Circumference --      Peak Flow --      Pain Score 04/13/24 1710 0     Pain Loc --      Pain Education --      Exclude from Growth Chart --    No data found.  Updated Vital Signs BP 105/66 (BP Location: Left Arm)   Pulse 79   Temp 97.7 F (36.5 C) (Oral)   Resp 18   LMP 03/01/2024 (Approximate)   SpO2 99%   Visual Acuity Right Eye Distance:   Left Eye Distance:   Bilateral Distance:    Right Eye Near:   Left Eye Near:    Bilateral Near:     Physical Exam Vitals and nursing note reviewed.  Constitutional:      General: She is not in acute distress.    Appearance: Normal appearance. She is not ill-appearing.  Eyes:     Conjunctiva/sclera: Conjunctivae normal.  Abdominal:     General: Abdomen is flat.  Genitourinary:    Comments: Deferred at this time. Skin:  Capillary Refill: Capillary refill takes less than 2 seconds.  Neurological:     Mental Status: She is alert.      UC Treatments / Results  Labs (all labs ordered are listed, but only abnormal results are displayed) Labs Reviewed  POCT URINE PREGNANCY - Normal  POCT URINE DIPSTICK  CERVICOVAGINAL ANCILLARY ONLY    EKG   Radiology No results found.  Procedures Procedures (including critical care time)  Medications Ordered in UC Medications - No data to display  Initial Impression / Assessment and Plan / UC Course  I have reviewed the triage vital signs and the nursing notes.  Pertinent labs & imaging results that were available during my care of the patient were reviewed by me and considered in my medical decision making (see chart for details).     This patient presents to the UC for concern of STI testing.  Differential diagnosis includes gonorrhea, chlamydia, syphilis, HIV, trichomonas    Additional history obtained:  Additional history obtained  from chart review   Lab Tests:  I Ordered, and personally interpreted labs.  The pertinent results include: Urine pregnancy negative, urine dipstick unremarkable, cervical vaginal swab pending   Problem List / UC Course:  Patient presents to the urgent care today for concerns of STI testing.  She reports that she is sexually active with 1 partner and this partner is new.  She last had testing a year ago.  She is requesting routine screening at this time but denies any symptoms.  No reported vaginal discharge, drainage, or urethral symptoms.  No fever, chills, weakness or bodyaches.  She is requesting urine and swab testing but denies wanting HIV or syphilis testing through blood work. Physical exam is unremarkable however limited.  GU exam deferred. Point-of-care testing with urine pregnancy negative.  Urine dipstick also unremarkable.  Cervicovaginal swab currently in process and results to be available in external days.  Patient informed of this and she verbalized understanding for need for possible treatment if positive results were to arise.  She is otherwise stable for outpatient follow-up and discharged home.   Final Clinical Impressions(s) / UC Diagnoses   Final diagnoses:  Screening examination for STI     Discharge Instructions      You are seen at urgent care today for concerns of STI testing.  Your testing is currently in process and will be available in the next Avril days likely Monday or Tuesday next week.  For concerns of development of symptoms or changing symptoms, return to urgent care for evaluation.  Otherwise, you can follow-up on these results with urgent care or with your primary care provider.     ED Prescriptions   None    PDMP not reviewed this encounter.     [1]  Social History Tobacco Use   Smoking status: Never   Smokeless tobacco: Never  Vaping Use   Vaping status: Never Used  Substance Use Topics   Alcohol use: Never   Drug use: Never      Dominique Calvey A, PA-C 04/13/24 1813  "

## 2024-04-13 NOTE — Discharge Instructions (Addendum)
 You are seen at urgent care today for concerns of STI testing.  Your testing is currently in process and will be available in the next Avril days likely Monday or Tuesday next week.  For concerns of development of symptoms or changing symptoms, return to urgent care for evaluation.  Otherwise, you can follow-up on these results with urgent care or with your primary care provider.

## 2024-04-13 NOTE — ED Triage Notes (Signed)
 Patient requesting STD testing. Had a new partner. Denies any current symptoms.   Patient denies blood work at this time.

## 2024-04-16 ENCOUNTER — Ambulatory Visit (HOSPITAL_COMMUNITY): Payer: Self-pay

## 2024-04-16 LAB — CERVICOVAGINAL ANCILLARY ONLY
Bacterial Vaginitis (gardnerella): POSITIVE — AB
Candida Glabrata: NEGATIVE
Candida Vaginitis: POSITIVE — AB
Chlamydia: NEGATIVE
Comment: NEGATIVE
Comment: NEGATIVE
Comment: NEGATIVE
Comment: NEGATIVE
Comment: NEGATIVE
Comment: NORMAL
Neisseria Gonorrhea: NEGATIVE
Trichomonas: NEGATIVE
